# Patient Record
Sex: Male | Born: 1955 | Race: Black or African American | Hispanic: No | Marital: Married | Smoking: Never smoker
Health system: Southern US, Community
[De-identification: ages and names within clinical notes are randomized; demographics above are authoritative.]

## PROBLEM LIST (undated history)

## (undated) DIAGNOSIS — I10 Essential (primary) hypertension: Secondary | ICD-10-CM

## (undated) DIAGNOSIS — I2510 Atherosclerotic heart disease of native coronary artery without angina pectoris: Secondary | ICD-10-CM

## (undated) DIAGNOSIS — E119 Type 2 diabetes mellitus without complications: Secondary | ICD-10-CM

## (undated) DIAGNOSIS — E7800 Pure hypercholesterolemia, unspecified: Secondary | ICD-10-CM

## (undated) HISTORY — PX: CORONARY STENT PLACEMENT: SHX1402

## (undated) HISTORY — PX: CORONARY ARTERY BYPASS GRAFT: SHX141

## (undated) HISTORY — PX: AORTIC VALVE REPLACEMENT: SHX41

---

## 2006-02-10 ENCOUNTER — Inpatient Hospital Stay (HOSPITAL_COMMUNITY): Admission: AD | Admit: 2006-02-10 | Discharge: 2006-02-12 | Payer: Self-pay | Admitting: Internal Medicine

## 2006-02-11 ENCOUNTER — Encounter (INDEPENDENT_AMBULATORY_CARE_PROVIDER_SITE_OTHER): Payer: Self-pay | Admitting: Cardiology

## 2006-02-27 ENCOUNTER — Ambulatory Visit (HOSPITAL_COMMUNITY): Admission: RE | Admit: 2006-02-27 | Discharge: 2006-02-27 | Payer: Self-pay | Admitting: Cardiology

## 2007-09-15 ENCOUNTER — Encounter: Admission: RE | Admit: 2007-09-15 | Discharge: 2007-09-15 | Payer: Self-pay | Admitting: Cardiology

## 2007-10-08 ENCOUNTER — Ambulatory Visit (HOSPITAL_COMMUNITY): Admission: RE | Admit: 2007-10-08 | Discharge: 2007-10-08 | Payer: Self-pay | Admitting: Cardiology

## 2008-12-01 ENCOUNTER — Ambulatory Visit (HOSPITAL_COMMUNITY): Admission: RE | Admit: 2008-12-01 | Discharge: 2008-12-01 | Payer: Self-pay | Admitting: Cardiology

## 2009-12-11 ENCOUNTER — Observation Stay (HOSPITAL_COMMUNITY): Admission: EM | Admit: 2009-12-11 | Discharge: 2009-12-12 | Payer: Self-pay | Admitting: Emergency Medicine

## 2010-08-18 LAB — CBC
HCT: 38.1 % — ABNORMAL LOW (ref 39.0–52.0)
MCH: 32.3 pg (ref 26.0–34.0)
MCH: 33.1 pg (ref 26.0–34.0)
MCHC: 33.5 g/dL (ref 30.0–36.0)
MCHC: 33.7 g/dL (ref 30.0–36.0)
MCV: 95.9 fL (ref 78.0–100.0)
MCV: 96.1 fL (ref 78.0–100.0)
MCV: 96.3 fL (ref 78.0–100.0)
Platelets: 138 10*3/uL — ABNORMAL LOW (ref 150–400)
RDW: 13.2 % (ref 11.5–15.5)
WBC: 3.5 10*3/uL — ABNORMAL LOW (ref 4.0–10.5)
WBC: 4.4 10*3/uL (ref 4.0–10.5)
WBC: 7.1 10*3/uL (ref 4.0–10.5)

## 2010-08-18 LAB — POCT I-STAT, CHEM 8
BUN: 9 mg/dL (ref 6–23)
Calcium, Ion: 0.95 mmol/L — ABNORMAL LOW (ref 1.12–1.32)
Chloride: 105 mEq/L (ref 96–112)
Creatinine, Ser: 1.1 mg/dL (ref 0.4–1.5)
Glucose, Bld: 101 mg/dL — ABNORMAL HIGH (ref 70–99)
Hemoglobin: 13.9 g/dL (ref 13.0–17.0)
Potassium: 2.7 mEq/L — CL (ref 3.5–5.1)
Sodium: 141 mEq/L (ref 135–145)

## 2010-08-18 LAB — PROTIME-INR: INR: 0.99 (ref 0.00–1.49)

## 2010-08-18 LAB — LIPID PANEL
Cholesterol: 152 mg/dL (ref 0–200)
LDL Cholesterol: 95 mg/dL (ref 0–99)
Triglycerides: 56 mg/dL (ref <150)

## 2010-08-18 LAB — HEPARIN LEVEL (UNFRACTIONATED): Heparin Unfractionated: 0.32 IU/mL (ref 0.30–0.70)

## 2010-08-18 LAB — CARDIAC PANEL(CRET KIN+CKTOT+MB+TROPI)
CK, MB: 1 ng/mL (ref 0.3–4.0)
CK, MB: 1.1 ng/mL (ref 0.3–4.0)
Total CK: 126 U/L (ref 7–232)
Total CK: 149 U/L (ref 7–232)
Troponin I: 0.01 ng/mL (ref 0.00–0.06)

## 2010-08-18 LAB — DIFFERENTIAL
Basophils Absolute: 0 10*3/uL (ref 0.0–0.1)
Eosinophils Absolute: 0.1 10*3/uL (ref 0.0–0.7)
Eosinophils Relative: 1 % (ref 0–5)
Lymphocytes Relative: 36 % (ref 12–46)
Lymphs Abs: 1.6 10*3/uL (ref 0.7–4.0)
Monocytes Absolute: 0.6 10*3/uL (ref 0.1–1.0)
Neutrophils Relative %: 48 % (ref 43–77)

## 2010-08-18 LAB — URINALYSIS, ROUTINE W REFLEX MICROSCOPIC
Hgb urine dipstick: NEGATIVE
Ketones, ur: NEGATIVE mg/dL
Protein, ur: NEGATIVE mg/dL
pH: 7 (ref 5.0–8.0)

## 2010-08-18 LAB — COMPREHENSIVE METABOLIC PANEL
ALT: 15 U/L (ref 0–53)
Albumin: 4.2 g/dL (ref 3.5–5.2)
Calcium: 9.2 mg/dL (ref 8.4–10.5)
Chloride: 105 mEq/L (ref 96–112)
Creatinine, Ser: 1.04 mg/dL (ref 0.4–1.5)
GFR calc non Af Amer: >60 mL/min (ref >60)
Total Bilirubin: 0.8 mg/dL (ref 0.3–1.2)

## 2010-08-18 LAB — BASIC METABOLIC PANEL
CO2: 29 mEq/L (ref 19–32)
Calcium: 8.7 mg/dL (ref 8.4–10.5)
GFR calc non Af Amer: >60 mL/min (ref >60)
Glucose, Bld: 105 mg/dL — ABNORMAL HIGH (ref 70–99)

## 2010-08-18 LAB — POCT CARDIAC MARKERS: CKMB, poc: <1 ng/mL — ABNORMAL LOW (ref 1.0–8.0)

## 2010-08-18 LAB — MAGNESIUM: Magnesium: 2.4 mg/dL (ref 1.5–2.5)

## 2010-09-08 LAB — CREATININE, SERUM: GFR calc non Af Amer: >60 mL/min (ref >60)

## 2010-10-18 NOTE — Discharge Summary (Signed)
Jeffery Conner, Jeffery Conner               ACCOUNT NO.:  [id number]   MEDICAL RECORD NO.:  [id number]          PATIENT TYPE:  INP   LOCATION:  2919                         FACILITY:  [facility]   PHYSICIAN:  Lonia Blood, M.D.       DATE OF BIRTH:  1955-08-15   DATE OF ADMISSION:  02/10/2006  DATE OF DISCHARGE:  02/12/2006                                 DISCHARGE SUMMARY   PRIMARY CARE PHYSICIAN:  Dr. Andi Devon   DISCHARGE DIAGNOSES:  1. Coronary artery disease status post right coronary artery stent      placement by Dr. Jacinto Halim.  2. Slight distension of the ascending aorta and thoracic aorta.  The      patient will need follow-up CT angiogram of the chest and abdomen for      further evaluation.  3. Hypertension.  4. Hyperlipidemia.   DISCHARGE MEDICATIONS:  1. Plavix 75 mg by mouth daily.  2. Aspirin 325 mg by mouth daily.  3. Vytorin 10/20 mg by mouth daily.  4. Dyazide 25/37.5 mg daily.  5. Lotrel 10/20 mg by mouth daily.  6. Potassium chloride 10 mEq by mouth daily.   CONDITION AT DISCHARGE:  Jeffery Conner was discharged in good condition.  At  the time of discharge he was alert, oriented.  The patient was instructed to  follow up with Dr. Jacinto Halim and with Dr. Renae Gloss.  The patient was also  instructed to take his Plavix on a daily basis and to report immediately  back to the emergency room in case he has recurrent chest pain.   HISTORY AND PHYSICAL:  For complete admission history and physical refer to  the printed history and physical done by Dr. Renae Gloss on February 10, 2006.  Briefly, Jeffery Conner, a 55 year old gentleman with some family history of  coronary artery disease, presented to his primary care physician complaining  with worsening chest pain whenever performing an effort.  He was admitted  the hospital for further workup and observation.   HOSPITAL COURSE:  #1 - CHEST PAIN.  Mr. [name] was admitted on February 10, 2006, with diagnosis of unstable angina.  He was  observed on the telemetry  unit at [facility] and he had three sets of cardiac enzymes.  His  CKs, CK-MBs, and troponin I's were essentially within normal limits with  just mild elevation of the total CK.  It was deemed that the patient did not  suffer a heart attack.  The patient was seen in consultation by Dr. Jacinto Halim  from cardiology services and on February 11, 2006, the patient was taken to  the cardiac catheterization laboratory where he had a percutaneous  intervention to the right coronary artery.  The patient tolerated the  procedure without any complications.  Post placement of this bare-metal  stent the patient will need to take at least 6 weeks of Plavix and he will  follow up with Dr. Jacinto Halim.  Of note, the patient did not have any recurrent  chest pain while he was hospitalized at [facility].   #2 - HYPERLIPIDEMIA.  Mr.  Conner' fasting lipid panel showed an LDL of 121.  Given the fact that he was newly diagnosed with coronary artery disease, we  would treat this hyperlipidemia with a combination of statin and ezetimibe  on a daily basis.  The patient will have a further fasting lipid panel  checked in 6 weeks by his primary care physician to further adjust the  treatment as needed.   #3 - HYPOKALEMIA.  Upon admission Jeffery Conner was found to have a potassium  of 3.0.  He had aggressive potassium replacement and by hospital day #3 a  repeat potassium level at the time of discharge was 3.3.  We have discharged  the patient on daily potassium supplements given the fact that he was  already on triamterene  but without good success of controlling his  potassium.      Lonia Blood, M.D.  Electronically Signed     SL/MEDQ  D:  02/13/2006  T:  02/16/2006  Job:  [id number]   cc:   Merlene Laughter. Renae Gloss, M.D.  Cristy Hilts. Jacinto Halim, MD

## 2010-10-18 NOTE — Cardiovascular Report (Signed)
Jeffery Conner, Jeffery Conner               ACCOUNT NO.:  [id number]   MEDICAL RECORD NO.:  [id number]          PATIENT TYPE:  INP   LOCATION:  2919                         FACILITY:  [facility]   PHYSICIAN:  Cristy Hilts. Jacinto Halim, MD       DATE OF BIRTH:  February 13, 1956   DATE OF PROCEDURE:  02/11/2006  DATE OF DISCHARGE:                              CARDIAC CATHETERIZATION   REFERRING PHYSICIAN:  Merlene Laughter. Renae Gloss, M.D.   PROCEDURES PERFORMED:  1. Left ventriculography.  2. Selective right and left coronary arteriography.  3. Ascending abdominal aortogram.  4. PTA and stenting of the right coronary artery.   INDICATION:  Jeffery Conner is a 55 year old gentleman with a  longstanding history of hypertension and a strong family history of  premature coronary artery disease who has been complaining of new onset of  exertional angina pectoris in the last 2 weeks and which has accelerated in  the last 1 week.  He was admitted to the hospital for unstable anginal  symptoms.  Hence, given his strong family history, he was brought to the  cardiac catheterization lab to evaluate his coronary anatomy.   Ascending aortogram, abdominal aortogram was performed because of suspicion  for ascending aortic aneurysm and abdominal aneurysm.  Also, there was  suspicion for congenital bicuspid aortic valve and aortic regurgitation.   HEMODYNAMIC DATA:  The left ventricular pressure was 139/11 with end-  diastolic pressure of 17 mmHg.  The aortic pressures were 137/93 with a mean  of 112 mmHg.  There was no significant pressure gradient across the aortic  valve.   ANGIOGRAPHIC DATA:  Left ventricle.  Left ventricular systolic function was  normal with the ejection fraction of 60%.  No significant mitral  regurgitation.   Right coronary artery.  The right coronary artery is a very large caliber  vessel.  There is mild diffuse disease in its proximal and mid segment  constituting 10-15%.  In the mid segment of the  RCA, at the site of RV  branch origin, there is a focal dissection with 80% stenosis.  The LV branch  has a 90% stenosis.  The right coronary artery continues as a moderate sized  PDA and a large PLA which has mild disease.   Left main.  Left main is a very large caliber vessel.   Circumflex.  Circumflex is very small and is normal.   LAD.  LAD is a large caliber vessel.  It gives rise into a large diagonal 1,  which is tortuous, and a moderate sized diagonal 2.  LAD wraps around the  apex.  It is smooth and otherwise appears to be normal.   Ascending aortogram and thoracic aortogram.  Ascending aortogram and  thoracic aortogram revealed diffuse aortic dilatation of the entire thoracic  aorta.  There was bicuspid aortic valve with mild aortic regurgitation.   Abdominal aortogram.  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  They were widely patent.  The abdominal aorta  was tortuous.  The aortoiliac bifurcation was patent.   INTERVENTIONAL DATA:  Successful  PTCA and direct stenting of the mid right  coronary artery with a 4.0 x 18 mm Vision stent deployed at 14 atmospheres  of pressure with reduction of stenosis from 80% to 0% with TIMI-3 to TIMI-3  flow maintained at the end of the procedure.  There was no evidence of  dissection or thrombus.  The LV branch was still patent at the end of the  procedure.   RECOMMENDATIONS:  The patient will need outpatient CT angiography of his  thoracic and abdominal aorta.  He will need aspirin and Plavix for at least  a period of 4-6 weeks and aspirin indefinitely.  He will need continued risk  factor evaluation.   TECHNIQUE OF THE PROCEDURE:  Under usual sterile precautions, using a 6  French right femoral artery axis a 6 [place] multipurpose B2 catheter was  advanced into the ascending aorta with a 0.025 J wire.  The catheter was  gently advanced into the left ventricle, left ventricular pressures were   monitored.  Hand contrast injection of the left ventricle was performed both  in the LAO and RAO projections.  The catheter was flushed with saline,  pulled back into the ascending aorta and pressure gradient across the aortic  valve was monitored.  The ascending aortogram was performed but it was  suboptimal.  The catheter was then pulled out of the body in the usual  fashion and a 6 [place] AL1 catheter was utilized and advanced into the root  of the aorta and right coronary artery was selectively engaged and  angiography was performed.  Then the same catheter was utilized to engage  the left main coronary artery and angiography was performed.  Then the  catheter was pulled out of the body in usual fashion and a 6 French pigtail  catheter was advanced into the root of the aorta and ascending and thoracic  aortogram was performed with injection of 40 mL of contrast at 20 mL per  second.  Then the same catheter was pulled back into the abdominal aorta and  abdominal aortogram was performed with injection of 20 mL of contrast.  Then  the catheter was pulled out of the body over a J wire.   TECHNIQUE AND PREVENTION:  A 6 French sheath was changed to a 7 [place]  sheath because of inability to use a 6 [place] guide catheter to engage right  coronary artery.  Because of abdominal aortic tortuosity, there was twisting  of the guide catheter.  Hence, a 6 French sheath was exchanged to a 7 [place]  long 45 cm sheath and this sheath was introduced into the abdominal aorta.  Then using a 7 [place] FR5 guide the right coronary artery was selectively  engaged.  Using Angiomax for anticoagulation, a 190 cm x 0.014 inch Marker  ATW guide wire was advanced in the right coronary artery and placed in the  distal RCA.  The tip was placed in the distal RCA.  Lesion length was  carefully measured.  Then I decided to proceed with direct stenting with a 4.0 x 18 mm Vision stent and this was deployed at 14  atmospheres of pressure  peak for a total of a 60 seconds.  Post stent deployment the balloon was  withdrawn and 200 mcg of intracoronary nitroglycerin was administered.  Excellent results were noted.  Then the guide wire was withdrawn.  Guide  catheter disengaged and pulled out of the body in the usual fashion.  Patient tolerated the procedure  well.  The long sheath was changed to a  short 7 [place] sheath.  This was sutured in place and patient was  transferred to the holding area in a stable condition.  No immediate  complications noted.      Cristy Hilts. Jacinto Halim, MD  Electronically Signed     JRG/MEDQ  D:  02/11/2006  T:  02/12/2006  Job:  [id number]   cc:   Merlene Laughter. Renae Gloss, M.D.

## 2011-04-15 DIAGNOSIS — I10 Essential (primary) hypertension: Secondary | ICD-10-CM | POA: Insufficient documentation

## 2011-04-15 DIAGNOSIS — I2510 Atherosclerotic heart disease of native coronary artery without angina pectoris: Secondary | ICD-10-CM | POA: Insufficient documentation

## 2011-04-15 DIAGNOSIS — Z954 Presence of other heart-valve replacement: Secondary | ICD-10-CM | POA: Insufficient documentation

## 2011-06-30 ENCOUNTER — Other Ambulatory Visit: Payer: Self-pay

## 2011-06-30 ENCOUNTER — Emergency Department (INDEPENDENT_AMBULATORY_CARE_PROVIDER_SITE_OTHER): Payer: 59

## 2011-06-30 ENCOUNTER — Emergency Department (HOSPITAL_BASED_OUTPATIENT_CLINIC_OR_DEPARTMENT_OTHER)
Admission: EM | Admit: 2011-06-30 | Discharge: 2011-07-01 | Disposition: A | Discharge status: Home / Self Care | Payer: 59 | Attending: Emergency Medicine | Admitting: Emergency Medicine

## 2011-06-30 ENCOUNTER — Encounter (HOSPITAL_BASED_OUTPATIENT_CLINIC_OR_DEPARTMENT_OTHER): Payer: Self-pay | Admitting: *Deleted

## 2011-06-30 DIAGNOSIS — I517 Cardiomegaly: Secondary | ICD-10-CM

## 2011-06-30 DIAGNOSIS — R079 Chest pain, unspecified: Secondary | ICD-10-CM | POA: Insufficient documentation

## 2011-06-30 DIAGNOSIS — E876 Hypokalemia: Secondary | ICD-10-CM | POA: Insufficient documentation

## 2011-06-30 DIAGNOSIS — I10 Essential (primary) hypertension: Secondary | ICD-10-CM | POA: Insufficient documentation

## 2011-06-30 DIAGNOSIS — Z79899 Other long term (current) drug therapy: Secondary | ICD-10-CM | POA: Insufficient documentation

## 2011-06-30 DIAGNOSIS — R0789 Other chest pain: Secondary | ICD-10-CM

## 2011-06-30 DIAGNOSIS — E7800 Pure hypercholesterolemia, unspecified: Secondary | ICD-10-CM | POA: Insufficient documentation

## 2011-06-30 HISTORY — DX: Essential (primary) hypertension: I10

## 2011-06-30 HISTORY — DX: Pure hypercholesterolemia, unspecified: E78.00

## 2011-06-30 LAB — COMPREHENSIVE METABOLIC PANEL
AST: 16 U/L (ref 0–37)
Albumin: 4.2 g/dL (ref 3.5–5.2)
Chloride: 99 mEq/L (ref 96–112)
Creatinine, Ser: 1 mg/dL (ref 0.50–1.35)
Potassium: 2.5 mEq/L — CL (ref 3.5–5.1)
Total Bilirubin: 0.3 mg/dL (ref 0.3–1.2)
Total Protein: 7.2 g/dL (ref 6.0–8.3)

## 2011-06-30 LAB — CARDIAC PANEL(CRET KIN+CKTOT+MB+TROPI)
Relative Index: 1.4 (ref 0.0–2.5)
Total CK: 265 U/L — ABNORMAL HIGH (ref 7–232)
Troponin I: <0.3 ng/mL (ref <0.30)

## 2011-06-30 LAB — DIFFERENTIAL
Basophils Absolute: 0 10*3/uL (ref 0.0–0.1)
Basophils Relative: 1 % (ref 0–1)
Eosinophils Absolute: 0.1 10*3/uL (ref 0.0–0.7)
Monocytes Absolute: 0.7 10*3/uL (ref 0.1–1.0)
Neutro Abs: 1.7 10*3/uL (ref 1.7–7.7)
Neutrophils Relative %: 39 % — ABNORMAL LOW (ref 43–77)

## 2011-06-30 LAB — CBC
MCHC: 35.5 g/dL (ref 30.0–36.0)
RDW: 12.6 % (ref 11.5–15.5)

## 2011-06-30 MED ORDER — POTASSIUM CHLORIDE 10 MEQ/100ML IV SOLN
10.0000 meq | Freq: Once | INTRAVENOUS | Status: AC
  Administered 2011-06-30: 10 meq via INTRAVENOUS
  Filled 2011-06-30: qty 100

## 2011-06-30 MED ORDER — POTASSIUM CHLORIDE CRYS ER 20 MEQ PO TBCR
40.0000 meq | EXTENDED_RELEASE_TABLET | Freq: Two times a day (BID) | ORAL | Status: DC
  Administered 2011-06-30: 40 meq via ORAL
  Filled 2011-06-30: qty 2

## 2011-06-30 NOTE — ED Provider Notes (Signed)
This chart was scribed for Joya Gaskins, MD by Williemae Natter. The patient was seen in room MH08/MH08 at 11:01 PM.  CSN: [id number]  Arrival date & time 06/30/11  2052   First MD Initiated Contact with Patient 06/30/11 2258      Chief Complaint  Patient presents with  . Chest Pain     HPI Jeffery Conner is a 56 y.o. male with a history of stent placement and valve replacement who presents to the Emergency Department complaining of pressure in chest. Rates pain a 1/10 at the movement. Pt reported feeling pressure in chest for 3 days with little to no pain. Pt denies any sweating, nausea, vomit, shortness of breath, weakness, pain, coughing, diarrhea, or abdominal pain. Stent and valve replacements done at [facility]. Reports pressure in chest worse with bending forward No exertional CP It is not pleuritic No trauma/injury to chest  PCP- Dr. Renae Gloss Past Medical History  Diagnosis Date  . Hypertension   . Hypercholesterolemia     Past Surgical History  Procedure Date  . Aortic valve replacement   . Coronary artery bypass graft   . Coronary stent placement     No family history on file.  History  Substance Use Topics  . Smoking status: Never Smoker   . Smokeless tobacco: Not on file  . Alcohol Use: No      Review of Systems 10 Systems reviewed and are negative for acute change except as noted in the HPI.  Allergies  Review of patient's allergies indicates no known allergies.  Home Medications   Current Outpatient Rx  Name Route Sig Dispense Refill  . AMLODIPINE BESYLATE 10 MG PO TABS Oral Take 10 mg by mouth daily.    . ASPIRIN 325 MG PO TBEC Oral Take 325 mg by mouth daily.    Marland Kitchen BENAZEPRIL HCL 20 MG PO TABS Oral Take 20 mg by mouth daily.    . CHLORTHALIDONE 25 MG PO TABS Oral Take 25 mg by mouth daily.    . FESOTERODINE FUMARATE ER 8 MG PO TB24 Oral Take 8 mg by mouth daily.    Marland Kitchen LABETALOL HCL 200 MG PO TABS Oral Take 200 mg by mouth 2 (two) times  daily.    . B-12 5000 MCG PO TBDP Oral Take 1 tablet by mouth 2 (two) times a week. Take on Monday and Thursday    . POTASSIUM CHLORIDE CRYS ER 20 MEQ PO TBCR Oral Take 20 mEq by mouth 2 (two) times daily.    Marland Kitchen PRAVASTATIN SODIUM 40 MG PO TABS Oral Take 40 mg by mouth daily.      BP 177/94  Pulse 62  Temp(Src) 98.2 F (36.8 C) (Oral)  Resp 16  Ht 6' (1.829 m)  Wt 224 lb (101.606 kg)  BMI 30.38 kg/m2  SpO2 99%  Physical Exam CONSTITUTIONAL: Well developed/well nourished HEAD AND FACE: Normocephalic/atraumatic EYES: EOMI/PERRL ENMT: Mucous membranes moist NECK: supple no meningeal signs SPINE:entire spine nontender CV: murmur noted LUNGS: Lungs are clear to auscultation bilaterally, no apparent distress ABDOMEN: soft, nontender, no rebound or guarding GU:no cva tenderness NEURO: Pt is awake/alert, moves all extremitiesx4, no focal motor defitis No distress noted EXTREMITIES: pulses normal, full ROM, no edema noted SKIN: warm, color normal PSYCH: no abnormalities of mood noted  ED Course  Procedures  DIAGNOSTIC STUDIES: Oxygen Saturation is 99% on room air, normal by my interpretation.    COORDINATION OF CARE:  Medications  potassium chloride SA (K-DUR,KLOR-CON) CR  tablet 40 mEq   potassium chloride 10 mEq in 100 mL IVPB       Labs Reviewed  CARDIAC PANEL(CRET KIN+CKTOT+MB+TROPI) - Abnormal; Notable for the following:    Total CK 265 (*)    All other components within normal limits  CBC - Abnormal; Notable for the following:    Platelets 144 (*)    All other components within normal limits  DIFFERENTIAL - Abnormal; Notable for the following:    Neutrophils Relative 39 (*)    Monocytes Relative 16 (*)    All other components within normal limits  COMPREHENSIVE METABOLIC PANEL - Abnormal; Notable for the following:    Potassium 2.5 (*)    Glucose, Bld 117 (*)    GFR calc non Af Amer 82 (*)    All other components within normal limits   Dg Chest 2  View  06/30/2011  *RADIOLOGY REPORT*  Clinical Data: Left chest pressure  CHEST - 2 VIEW  Comparison: 12/11/2009  Findings: Enlargement of cardiac silhouette post median sternotomy, CABG, and valve replacement. Tortuous aorta. Pulmonary vascularity normal. Lungs clear. No pleural effusion or pneumothorax. Minimal chronic peribronchial thickening. Numerous cardiac monitoring lines project over chest. Bones unremarkable.  IMPRESSION: Enlargement of cardiac silhouette. No acute abnormalities.  Original Report Authenticated By: Lollie Marrow, M.D.    12:01 AM Pt with very atypical CP He s/p AV replacement for bicuspid AV Also has h/o CAD  He agrees to stay for repeat troponin and for KCL supplementation but does not want admission, no significant EKG changes Well appearing, no distress Doubt ACS/Dissection/PE at this time Doubt pericardial effusion.  He is nontoxic, using his iPad, resting comfortably  The patient appears reasonably screened and/or stabilized for discharge and I doubt any other medical condition or other [facility] requiring further screening, evaluation, or treatment in the ED at this time prior to discharge.   MDM  Nursing notes reviewed and considered in documentation All labs/vitals reviewed and considered xrays reviewed and considered Previous records reviewed and considered      Date: 07/01/2011  Rate: 53  Rhythm: sinus bradycardia  QRS Axis: left  Intervals: PR prolonged  ST/T Wave abnormalities: nonspecific ST changes  Conduction Disutrbances:first-degree A-V block   Narrative Interpretation:   Old EKG Reviewed: unchanged      I personally performed the services described in this documentation, which was scribed in my presence. The recorded information has been reviewed and considered.      Joya Gaskins, MD 07/01/11 [phone]

## 2011-06-30 NOTE — ED Notes (Signed)
Received pt. From triage via w/c, pt. Alert and oriented, transferred to stretcher, gait steady, NAD noted, Pt. Reports chest discomfort x3 days,

## 2011-06-30 NOTE — ED Notes (Signed)
Pt c/o left chest pressure since Saturday. Pt denies SOB, nausea or diaphoresis.

## 2011-07-01 NOTE — ED Notes (Signed)
Pt. Alert and oriented, NAD noted, respirations even and regular, pt ambulatory gait steady

## 2012-02-29 ENCOUNTER — Emergency Department (HOSPITAL_BASED_OUTPATIENT_CLINIC_OR_DEPARTMENT_OTHER): Payer: 59

## 2012-02-29 ENCOUNTER — Emergency Department (HOSPITAL_BASED_OUTPATIENT_CLINIC_OR_DEPARTMENT_OTHER)
Admission: EM | Admit: 2012-02-29 | Discharge: 2012-02-29 | Disposition: A | Discharge status: Home / Self Care | Payer: 59 | Attending: Emergency Medicine | Admitting: Emergency Medicine

## 2012-02-29 ENCOUNTER — Encounter (HOSPITAL_BASED_OUTPATIENT_CLINIC_OR_DEPARTMENT_OTHER): Payer: Self-pay | Admitting: Emergency Medicine

## 2012-02-29 DIAGNOSIS — I10 Essential (primary) hypertension: Secondary | ICD-10-CM | POA: Insufficient documentation

## 2012-02-29 DIAGNOSIS — L02419 Cutaneous abscess of limb, unspecified: Secondary | ICD-10-CM | POA: Insufficient documentation

## 2012-02-29 DIAGNOSIS — Z7982 Long term (current) use of aspirin: Secondary | ICD-10-CM | POA: Insufficient documentation

## 2012-02-29 DIAGNOSIS — L03116 Cellulitis of left lower limb: Secondary | ICD-10-CM

## 2012-02-29 DIAGNOSIS — I2510 Atherosclerotic heart disease of native coronary artery without angina pectoris: Secondary | ICD-10-CM | POA: Insufficient documentation

## 2012-02-29 HISTORY — DX: Atherosclerotic heart disease of native coronary artery without angina pectoris: I25.10

## 2012-02-29 MED ORDER — BACITRACIN 500 UNIT/GM EX OINT
1.0000 "application " | TOPICAL_OINTMENT | Freq: Two times a day (BID) | CUTANEOUS | Status: DC
  Administered 2012-02-29: 1 via TOPICAL
  Filled 2012-02-29: qty 0.9

## 2012-02-29 MED ORDER — BACITRACIN ZINC 500 UNIT/GM EX OINT: TOPICAL_OINTMENT | Freq: Two times a day (BID) | CUTANEOUS | Status: AC

## 2012-02-29 NOTE — ED Notes (Signed)
Pt c/o skin infection to LLE; was admitted to [facility] recently for same (d/c'd Wed, was there x 6d); swelling, weeping wounds present on LLE

## 2012-02-29 NOTE — ED Provider Notes (Signed)
History     CSN: [id number]  Arrival date & time 02/29/12  1112   First MD Initiated Contact with Patient 02/29/12 1201      Chief Complaint  Patient presents with  . Cellulitis    (Consider location/radiation/quality/duration/timing/severity/associated sxs/prior treatment) HPI Comments: 56 year old male with a history of a recent cellulitis to his left lower extremity who spent 6 days in the hospital receiving therapy including antibiotics for his leg. He was discharged from the hospital on Wednesday, for the last several days he has been performing dressing changes, continuing his antibiotics and states that over the last 24 hours he has noticed increased weeping from the wound. He is using topical Neosporin on the wound. He is unsure of the name of his antibiotics that he is taking.  He denies fevers, states that the swelling is improving, the redness has completely resolved and the only place that is redness over the surface of the wound. He denies any foul smell, fever, chills, nausea, vomiting. He has had a normal appetite. He does have mild pain with ambulation.  The history is provided by the patient and the spouse.    Past Medical History  Diagnosis Date  . Hypertension   . Hypercholesterolemia   . Coronary artery disease     Past Surgical History  Procedure Date  . Aortic valve replacement   . Coronary artery bypass graft   . Coronary stent placement     No family history on file.  History  Substance Use Topics  . Smoking status: Never Smoker   . Smokeless tobacco: Not on file  . Alcohol Use: No      Review of Systems  All other systems reviewed and are negative.    Allergies  Review of patient's allergies indicates no known allergies.  Home Medications   Current Outpatient Rx  Name Route Sig Dispense Refill  . CEPHALEXIN 500 MG PO CAPS Oral Take 500 mg by mouth.    . SULFAMETHOXAZOLE-TMP DS 800-160 MG PO TABS Oral Take 1 tablet by mouth.    .  AMLODIPINE BESYLATE 10 MG PO TABS Oral Take 10 mg by mouth daily.    . ASPIRIN 325 MG PO TBEC Oral Take 325 mg by mouth daily.    Marland Kitchen BACITRACIN ZINC 500 UNIT/GM EX OINT Topical Apply topically 2 (two) times daily. 120 g 2  . BENAZEPRIL HCL 20 MG PO TABS Oral Take 20 mg by mouth daily.    . CHLORTHALIDONE 25 MG PO TABS Oral Take 25 mg by mouth daily.    . FESOTERODINE FUMARATE ER 8 MG PO TB24 Oral Take 8 mg by mouth daily.    Marland Kitchen LABETALOL HCL 200 MG PO TABS Oral Take 200 mg by mouth 2 (two) times daily.    . B-12 5000 MCG PO TBDP Oral Take 1 tablet by mouth 2 (two) times a week. Take on Monday and Thursday    . POTASSIUM CHLORIDE CRYS ER 20 MEQ PO TBCR Oral Take 20 mEq by mouth 2 (two) times daily.    Marland Kitchen PRAVASTATIN SODIUM 40 MG PO TABS Oral Take 40 mg by mouth daily.      BP 135/90  Pulse 76  Temp 98.5 F (36.9 C) (Oral)  Resp 18  SpO2 99%  Physical Exam  Nursing note and vitals reviewed. Constitutional: He appears well-developed and well-nourished. No distress.  HENT:  Head: Normocephalic and atraumatic.  Mouth/Throat: Oropharynx is clear and moist. No oropharyngeal exudate.  Eyes: Conjunctivae normal  and EOM are normal. Pupils are equal, round, and reactive to light. Right eye exhibits no discharge. Left eye exhibits no discharge. No scleral icterus.  Neck: Normal range of motion. Neck supple. No JVD present. No thyromegaly present.  Cardiovascular: Normal rate, regular rhythm, normal heart sounds and intact distal pulses.  Exam reveals no gallop and no friction rub.   No murmur heard. Pulmonary/Chest: Effort normal and breath sounds normal. No respiratory distress. He has no wheezes. He has no rales.  Abdominal: Soft. Bowel sounds are normal. He exhibits no distension and no mass. There is no tenderness.  Musculoskeletal: Normal range of motion. He exhibits edema ( Asymmetrical 1+ edema to the left lower extremity including the foot). He exhibits no tenderness.  Lymphadenopathy:     He has no cervical adenopathy.  Neurological: He is alert. Coordination normal.  Skin: Skin is warm and dry. Rash noted. There is erythema.       Cobblestoned erythematous base to a wound that is approximately 8 cm in diameter in a square-like formation over the left lower extremity. There is serous drainage, no foul odor, no purulence, no surrounding erythema.  Psychiatric: He has a normal mood and affect. His behavior is normal.    ED Course  Procedures (including critical care time)  Labs Reviewed - No data to display Dg Tibia/fibula Left  02/29/2012  *RADIOLOGY REPORT*  Clinical Data: Skin infection  LEFT TIBIA AND FIBULA - 2 VIEW  Comparison: None.  Findings: No fracture or dislocation is seen.  The joint spaces are preserved.  Moderate soft tissue swelling overlying the lateral malleolus.  Surgical clips along the medial aspect of the proximal tibia.  No radiographic findings to suggest osteomyelitis.  IMPRESSION: No radiographic findings to suggest osteomyelitis.  Moderate soft tissue swelling overlying the lateral malleolus.   Original Report Authenticated By: Charline Bills, M.D.      1. Cellulitis of left lower leg       MDM  No other joint arthropathies, I suspect that he is improving despite the appearance of the wound which does have increased drainage from the wound. It is serous, he is afebrile, this could be a reaction to the topical antibiotic cream. X-ray pending to rule out subcutaneous air though the patient has minimal tenderness on exam and minimal pain.  Nursing staff has verified that the patient is currently taking bacitracin and Keflex for antibiotic coverage. The x-rays have been interpreted as normal except for some soft tissue swelling but no signs of emphysema of the tissues or underlying osteomyelitis. The patient states that he is comfortable going home for a recheck, bacitracin to be applied and no more Neosporin. There is also no indication to change  antibiotics at this time.      Vida Roller, MD 02/29/12 1335

## 2012-12-23 ENCOUNTER — Emergency Department (HOSPITAL_BASED_OUTPATIENT_CLINIC_OR_DEPARTMENT_OTHER)
Admission: EM | Admit: 2012-12-23 | Discharge: 2012-12-23 | Disposition: A | Discharge status: Home / Self Care | Payer: 59 | Attending: Emergency Medicine | Admitting: Emergency Medicine

## 2012-12-23 ENCOUNTER — Encounter (HOSPITAL_BASED_OUTPATIENT_CLINIC_OR_DEPARTMENT_OTHER): Payer: Self-pay | Admitting: *Deleted

## 2012-12-23 DIAGNOSIS — T398X1A Poisoning by other nonopioid analgesics and antipyretics, not elsewhere classified, accidental (unintentional), initial encounter: Secondary | ICD-10-CM | POA: Insufficient documentation

## 2012-12-23 DIAGNOSIS — T50901A Poisoning by unspecified drugs, medicaments and biological substances, accidental (unintentional), initial encounter: Secondary | ICD-10-CM

## 2012-12-23 DIAGNOSIS — I2510 Atherosclerotic heart disease of native coronary artery without angina pectoris: Secondary | ICD-10-CM | POA: Insufficient documentation

## 2012-12-23 DIAGNOSIS — Y9389 Activity, other specified: Secondary | ICD-10-CM | POA: Insufficient documentation

## 2012-12-23 DIAGNOSIS — Z79899 Other long term (current) drug therapy: Secondary | ICD-10-CM | POA: Insufficient documentation

## 2012-12-23 DIAGNOSIS — E876 Hypokalemia: Secondary | ICD-10-CM

## 2012-12-23 DIAGNOSIS — R51 Headache: Secondary | ICD-10-CM | POA: Insufficient documentation

## 2012-12-23 DIAGNOSIS — Z7982 Long term (current) use of aspirin: Secondary | ICD-10-CM | POA: Insufficient documentation

## 2012-12-23 DIAGNOSIS — Y929 Unspecified place or not applicable: Secondary | ICD-10-CM | POA: Insufficient documentation

## 2012-12-23 DIAGNOSIS — I10 Essential (primary) hypertension: Secondary | ICD-10-CM | POA: Insufficient documentation

## 2012-12-23 DIAGNOSIS — E785 Hyperlipidemia, unspecified: Secondary | ICD-10-CM | POA: Insufficient documentation

## 2012-12-23 LAB — CBC WITH DIFFERENTIAL/PLATELET
Eosinophils Absolute: 0.1 10*3/uL (ref 0.0–0.7)
Eosinophils Relative: 2 % (ref 0–5)
Hemoglobin: 15.6 g/dL (ref 13.0–17.0)
Lymphocytes Relative: 37 % (ref 12–46)
Lymphs Abs: 1.8 10*3/uL (ref 0.7–4.0)
MCH: 32 pg (ref 26.0–34.0)
MCV: 91.2 fL (ref 78.0–100.0)
Monocytes Relative: 11 % (ref 3–12)
Neutrophils Relative %: 49 % (ref 43–77)
RBC: 4.88 MIL/uL (ref 4.22–5.81)
WBC: 4.7 10*3/uL (ref 4.0–10.5)

## 2012-12-23 LAB — COMPREHENSIVE METABOLIC PANEL
ALT: 15 U/L (ref 0–53)
Alkaline Phosphatase: 63 U/L (ref 39–117)
BUN: 22 mg/dL (ref 6–23)
CO2: 30 mEq/L (ref 19–32)
GFR calc Af Amer: 75 mL/min — ABNORMAL LOW (ref >90)
GFR calc non Af Amer: 65 mL/min — ABNORMAL LOW (ref >90)
Glucose, Bld: 122 mg/dL — ABNORMAL HIGH (ref 70–99)
Potassium: 2.5 mEq/L — CL (ref 3.5–5.1)
Sodium: 144 mEq/L (ref 135–145)
Total Protein: 7.6 g/dL (ref 6.0–8.3)

## 2012-12-23 MED ORDER — SODIUM CHLORIDE 0.9 % IV BOLUS (SEPSIS)
1000.0000 mL | INTRAVENOUS | Status: AC
  Administered 2012-12-23: 1000 mL via INTRAVENOUS

## 2012-12-23 MED ORDER — POTASSIUM CHLORIDE CRYS ER 20 MEQ PO TBCR
60.0000 meq | EXTENDED_RELEASE_TABLET | Freq: Once | ORAL | Status: AC
  Administered 2012-12-23: 60 meq via ORAL
  Filled 2012-12-23: qty 3

## 2012-12-23 NOTE — ED Provider Notes (Signed)
History    CSN: [id number] Arrival date & time 12/23/12  0917  First MD Initiated Contact with Patient 12/23/12 [phone]     Chief Complaint  Patient presents with  . Drug Overdose   (Consider location/radiation/quality/duration/timing/severity/associated sxs/prior Treatment HPI  Patient presents after accidental ingestion of multiple medical tablets. Just prior to ED evaluation the patient grabbed the wrong container of medication, and took 8 Excedrin tablets. He had a headache, which is typical for him. He states that since that time he has been anxious about the medication ingestion, but has no new fever, chills, dyspnea, chest pain, headache, confusion, dislocation or any other focal new changes. Patient typically takes an Excedrin for headache. No other recent notable medical issues.  Past Medical History  Diagnosis Date  . Hypertension   . Hypercholesterolemia   . Coronary artery disease    Past Surgical History  Procedure Laterality Date  . Aortic valve replacement    . Coronary artery bypass graft    . Coronary stent placement     History reviewed. No pertinent family history. History  Substance Use Topics  . Smoking status: Never Smoker   . Smokeless tobacco: Not on file  . Alcohol Use: No    Review of Systems  All other systems reviewed and are negative.    Allergies  Review of patient's allergies indicates no known allergies.  Home Medications   Current Outpatient Rx  Name  Route  Sig  Dispense  Refill  . amLODipine (NORVASC) 10 MG tablet   Oral   Take 10 mg by mouth daily.         Marland Kitchen aspirin 325 MG EC tablet   Oral   Take 325 mg by mouth daily.         . bacitracin ointment   Topical   Apply topically 2 (two) times daily.   120 g   2   . benazepril (LOTENSIN) 20 MG tablet   Oral   Take 20 mg by mouth daily.         . cephALEXin (KEFLEX) 500 MG capsule   Oral   Take 500 mg by mouth.         . chlorthalidone (HYGROTON) 25 MG  tablet   Oral   Take 25 mg by mouth daily.         Marland Kitchen Fesoterodine Fumarate (TOVIAZ) 8 MG TB24   Oral   Take 8 mg by mouth daily.         Marland Kitchen labetalol (NORMODYNE) 200 MG tablet   Oral   Take 200 mg by mouth 2 (two) times daily.         . Methylcobalamin (B-12) 5000 MCG TBDP   Oral   Take 1 tablet by mouth 2 (two) times a week. Take on Monday and Thursday         . potassium chloride SA (K-DUR,KLOR-CON) 20 MEQ tablet   Oral   Take 20 mEq by mouth 2 (two) times daily.         . pravastatin (PRAVACHOL) 40 MG tablet   Oral   Take 40 mg by mouth daily.         Marland Kitchen sulfamethoxazole-trimethoprim (BACTRIM DS) 800-160 MG per tablet   Oral   Take 1 tablet by mouth.          There were no vitals taken for this visit. Physical Exam  Nursing note and vitals reviewed. Constitutional: He is oriented to person, place, and time. He  appears well-developed. No distress.  HENT:  Head: Normocephalic and atraumatic.  Eyes: Conjunctivae and EOM are normal.  Cardiovascular: Normal rate and regular rhythm.   Pulmonary/Chest: Effort normal. No stridor. No respiratory distress.  Abdominal: He exhibits no distension.  Musculoskeletal: He exhibits no edema.  Neurological: He is alert and oriented to person, place, and time.  Skin: Skin is warm and dry.  Psychiatric: He has a normal mood and affect.    ED Course  Procedures (including critical care time) Labs Reviewed  CBC WITH DIFFERENTIAL  SALICYLATE LEVEL  ACETAMINOPHEN LEVEL  COMPREHENSIVE METABOLIC PANEL   No results found. No diagnosis found.  Pulse ox 99% room air normal no  Excedrin contains 250 mg acetaminophen, 250 mg ASA and 65mg  caffeine.  I discussed these amounts with the patient.  If the patient took 8 tablets, he consumed 2 mg acetaminophen, 2 mg aspirin. Patient weighs 100 kg. Toxic levels for these medications are approximately 4 g for acetaminophen, 15 g 4 aspirin.  This is initially reassuring.  MDM   Patient presenting after accidentally ingestion of multiple medications.  On exam is awake alert, in no distress.  Quantity of ingestion does not raise suspicion for systemic toxicity, and labs are reassuring for this.  Patient does have mild hypokalemia for which he received supplement.  He was advised to follow up with his primary care physician for likely check in one week.  Gerhard Munch, MD 12/23/12 1133

## 2012-12-23 NOTE — ED Notes (Signed)
Pt asking if we have attending physicians here or if we use "interns". pt reassured that he will be seen by an attending physician.

## 2012-12-23 NOTE — ED Notes (Signed)
Chart accessed to give lab levels to [place] from [employer]

## 2012-12-23 NOTE — ED Notes (Signed)
Pt amb to room 10 with quick steady gait in nad. Pt reports accidentally taking 8 tablets of [facility] brand excedrin migraine tablets around 8:30 am. Denies any c/o.

## 2012-12-23 NOTE — ED Notes (Signed)
Pt noted walking up to nurse's station shouting, states "This iv bag is not running, I've wasted two hours of my valuable time here for nothing!" Dr. Jeraldine Loots hears pt shouting and speaks with pt, pt assisted to room, into bed, iv placed on pump, explained to pt that iv was not running because pt had his arm bent. Iv placed on pump to alert this rn if pt bends arm, warm blanket given, tv turned on to channel of pt choice. Pt states "how am I supposed to get up and turn the channel?!" pt informed of his pending discharge home soon, and instructed to call staff if he wants channel changed. Pt has call light tied to bed rail.

## 2012-12-23 NOTE — ED Notes (Signed)
Dr. Jeraldine Loots notified of pt k+ being 2.5.

## 2013-06-18 ENCOUNTER — Emergency Department (HOSPITAL_BASED_OUTPATIENT_CLINIC_OR_DEPARTMENT_OTHER)
Admission: EM | Admit: 2013-06-18 | Discharge: 2013-06-19 | Disposition: A | Discharge status: Home / Self Care | Payer: 59 | Attending: Emergency Medicine | Admitting: Emergency Medicine

## 2013-06-18 ENCOUNTER — Encounter (HOSPITAL_BASED_OUTPATIENT_CLINIC_OR_DEPARTMENT_OTHER): Payer: Self-pay | Admitting: Emergency Medicine

## 2013-06-18 DIAGNOSIS — Z792 Long term (current) use of antibiotics: Secondary | ICD-10-CM | POA: Insufficient documentation

## 2013-06-18 DIAGNOSIS — I2510 Atherosclerotic heart disease of native coronary artery without angina pectoris: Secondary | ICD-10-CM | POA: Insufficient documentation

## 2013-06-18 DIAGNOSIS — Z79899 Other long term (current) drug therapy: Secondary | ICD-10-CM | POA: Insufficient documentation

## 2013-06-18 DIAGNOSIS — Z951 Presence of aortocoronary bypass graft: Secondary | ICD-10-CM | POA: Insufficient documentation

## 2013-06-18 DIAGNOSIS — E7800 Pure hypercholesterolemia, unspecified: Secondary | ICD-10-CM | POA: Insufficient documentation

## 2013-06-18 DIAGNOSIS — I10 Essential (primary) hypertension: Secondary | ICD-10-CM | POA: Insufficient documentation

## 2013-06-18 DIAGNOSIS — Z9861 Coronary angioplasty status: Secondary | ICD-10-CM | POA: Insufficient documentation

## 2013-06-18 DIAGNOSIS — Z9889 Other specified postprocedural states: Secondary | ICD-10-CM | POA: Insufficient documentation

## 2013-06-18 DIAGNOSIS — Z7982 Long term (current) use of aspirin: Secondary | ICD-10-CM | POA: Insufficient documentation

## 2013-06-18 LAB — CBC WITH DIFFERENTIAL/PLATELET
BASOS ABS: 0 10*3/uL (ref 0.0–0.1)
BASOS PCT: 0 % (ref 0–1)
Eosinophils Absolute: 0.1 10*3/uL (ref 0.0–0.7)
Eosinophils Relative: 3 % (ref 0–5)
HEMATOCRIT: 40.6 % (ref 39.0–52.0)
Hemoglobin: 14 g/dL (ref 13.0–17.0)
LYMPHS PCT: 37 % (ref 12–46)
Lymphs Abs: 1.7 10*3/uL (ref 0.7–4.0)
MCH: 32 pg (ref 26.0–34.0)
MCHC: 34.5 g/dL (ref 30.0–36.0)
MCV: 92.9 fL (ref 78.0–100.0)
Monocytes Absolute: 0.6 10*3/uL (ref 0.1–1.0)
Monocytes Relative: 12 % (ref 3–12)
NEUTROS ABS: 2.1 10*3/uL (ref 1.7–7.7)
NEUTROS PCT: 47 % (ref 43–77)
PLATELETS: 143 10*3/uL — AB (ref 150–400)
RBC: 4.37 MIL/uL (ref 4.22–5.81)
RDW: 13.2 % (ref 11.5–15.5)
WBC: 4.5 10*3/uL (ref 4.0–10.5)

## 2013-06-18 LAB — COMPREHENSIVE METABOLIC PANEL
ALBUMIN: 4 g/dL (ref 3.5–5.2)
ALK PHOS: 60 U/L (ref 39–117)
ALT: 20 U/L (ref 0–53)
AST: 19 U/L (ref 0–37)
BILIRUBIN TOTAL: 0.3 mg/dL (ref 0.3–1.2)
BUN: 25 mg/dL — ABNORMAL HIGH (ref 6–23)
CHLORIDE: 101 meq/L (ref 96–112)
CO2: 29 meq/L (ref 19–32)
CREATININE: 1.4 mg/dL — AB (ref 0.50–1.35)
Calcium: 9.5 mg/dL (ref 8.4–10.5)
GFR calc Af Amer: 62 mL/min — ABNORMAL LOW (ref >90)
GFR, EST NON AFRICAN AMERICAN: 54 mL/min — AB (ref >90)
Glucose, Bld: 114 mg/dL — ABNORMAL HIGH (ref 70–99)
POTASSIUM: 2.8 meq/L — AB (ref 3.7–5.3)
SODIUM: 145 meq/L (ref 137–147)
Total Protein: 7.1 g/dL (ref 6.0–8.3)

## 2013-06-18 LAB — TROPONIN I: Troponin I: <0.3 ng/mL (ref <0.30)

## 2013-06-18 MED ORDER — AMLODIPINE BESYLATE 5 MG PO TABS
10.0000 mg | ORAL_TABLET | Freq: Once | ORAL | Status: AC
  Administered 2013-06-18: 10 mg via ORAL
  Filled 2013-06-18: qty 2

## 2013-06-18 MED ORDER — POTASSIUM CHLORIDE CRYS ER 20 MEQ PO TBCR
40.0000 meq | EXTENDED_RELEASE_TABLET | Freq: Once | ORAL | Status: AC
Start: 2013-06-18 — End: 2013-06-18
  Administered 2013-06-18: 40 meq via ORAL
  Filled 2013-06-18: qty 2

## 2013-06-18 NOTE — ED Notes (Signed)
Dr. Wyvonnia Dusky is aware of the critical potassium 2.8

## 2013-06-18 NOTE — ED Provider Notes (Signed)
CSN: [id number]     Arrival date & time 06/18/13  2132 History   This chart was scribed for Ezequiel Essex, MD by Maree Erie, ED Scribe. The patient was seen in room MH06/MH06. Patient's care was started at 10:42 PM.     Chief Complaint  Patient presents with  . Hypertension    Patient is a 58 y.o. male presenting with hypertension. The history is provided by the patient. No language interpreter was used.  Hypertension    HPI Comments: Jeffery Conner is a 58 y.o. male who presents to the Emergency Department due to an episode of asymptomatic hypertension that occurred tonight. His BP prior to coming in was 207/117. He checks his BP every few days. He states his BP usually runs less than 951 systolic, typically around 150. He states he has been out of his Norvasc for about a week but has been taking his other BP medication compliantly. He takes them in the morning and has already taken his dose for today of all but the Norvasc. He denies any symptoms, including chest pain, headache, blurred vision, double vision, shortness of breath, abdominal pain or vomiting. He has a past medical history of hypercholesterolemia and CAD. He had a CABG surgery and a bovine aortic valve replacement surgery, both "a few years ago." He is not on anticoagulants currently. He denies previous MI.  His Cardiologist is Dr. Laurell Roof at [facility].   Past Medical History  Diagnosis Date  . Hypertension   . Hypercholesterolemia   . Coronary artery disease    Past Surgical History  Procedure Laterality Date  . Aortic valve replacement    . Coronary artery bypass graft    . Coronary stent placement     No family history on file. History  Substance Use Topics  . Smoking status: Never Smoker   . Smokeless tobacco: Never Used  . Alcohol Use: No    Review of Systems A complete 10 system review of systems was obtained and all systems are negative except as noted in the HPI and PMH.    Allergies  Review of  patient's allergies indicates no known allergies.  Home Medications   Current Outpatient Rx  Name  Route  Sig  Dispense  Refill  . aspirin 325 MG EC tablet   Oral   Take 325 mg by mouth daily.         . benazepril (LOTENSIN) 20 MG tablet   Oral   Take 20 mg by mouth daily.         . chlorthalidone (HYGROTON) 25 MG tablet   Oral   Take 25 mg by mouth daily.         Marland Kitchen labetalol (NORMODYNE) 200 MG tablet   Oral   Take 200 mg by mouth 2 (two) times daily.         . Methylcobalamin (B-12) 5000 MCG TBDP   Oral   Take 1 tablet by mouth 2 (two) times a week. Take on Monday and Thursday         . potassium chloride SA (K-DUR,KLOR-CON) 20 MEQ tablet   Oral   Take 20 mEq by mouth 2 (two) times daily.         . pravastatin (PRAVACHOL) 40 MG tablet   Oral   Take 40 mg by mouth daily.         Marland Kitchen amLODipine (NORVASC) 10 MG tablet   Oral   Take 10 mg by mouth daily.         Marland Kitchen  amLODipine (NORVASC) 10 MG tablet   Oral   Take 1 tablet (10 mg total) by mouth once.   30 tablet   0   . bacitracin ointment   Topical   Apply topically 2 (two) times daily.   120 g   2   . cephALEXin (KEFLEX) 500 MG capsule   Oral   Take 500 mg by mouth.         . Fesoterodine Fumarate (TOVIAZ) 8 MG TB24   Oral   Take 8 mg by mouth daily.         Marland Kitchen sulfamethoxazole-trimethoprim (BACTRIM DS) 800-160 MG per tablet   Oral   Take 1 tablet by mouth.          Triage Vitals: BP 216/120  Pulse 60  Temp(Src) 97.6 F (36.4 C) (Oral)  Resp 20  Ht 6' (1.829 m)  Wt 230 lb (104.327 kg)  BMI 31.19 kg/m2  SpO2 97%  Physical Exam  Nursing note and vitals reviewed. Constitutional: He is oriented to person, place, and time. He appears well-developed and well-nourished. No distress.  HENT:  Head: Normocephalic and atraumatic.  Eyes: EOM are normal.  Neck: Neck supple. No tracheal deviation present.  Cardiovascular: Normal rate, regular rhythm and normal heart sounds.   No  murmur heard. Pulmonary/Chest: Effort normal. No respiratory distress. He has no wheezes. He has no rales.  Abdominal: Soft. There is no tenderness. There is no rebound and no guarding.  Musculoskeletal: Normal range of motion.  Neurological: He is alert and oriented to person, place, and time.  CN 2-12 intact, no ataxia on finger to nose, no nystagmus, 5/5 strength throughout, no pronator drift, Romberg negative, normal gait.  Skin: Skin is warm and dry.  Psychiatric: He has a normal mood and affect. His behavior is normal.    ED Course  Procedures (including critical care time)  DIAGNOSTIC STUDIES: Oxygen Saturation is 97% on room air, normal by my interpretation.    COORDINATION OF CARE: 10:43 PM -BP 193/114 upon recheck. Will order CBC, CMP and Troponin I. Patient verbalizes understanding and agrees with treatment plan.   Filed Vitals:   06/18/13 2315 06/18/13 2330 06/18/13 2345 06/19/13 0000  BP: 194/109 188/108 188/108 183/114  Pulse: 54 51 50 50  Temp:      TempSrc:      Resp:      Height:      Weight:      SpO2: 92% 95% 94% 95%   Labs Review Labs Reviewed  CBC WITH DIFFERENTIAL - Abnormal; Notable for the following:    Platelets 143 (*)    All other components within normal limits  COMPREHENSIVE METABOLIC PANEL - Abnormal; Notable for the following:    Potassium 2.8 (*)    Glucose, Bld 114 (*)    BUN 25 (*)    Creatinine, Ser 1.40 (*)    GFR calc non Af Amer 54 (*)    GFR calc Af Amer 62 (*)    All other components within normal limits  TROPONIN I  MAGNESIUM   Imaging Review No results found.  EKG Interpretation    Date/Time:  Saturday June 18 2013 21:54:36 EST Ventricular Rate:  53 PR Interval:  196 QRS Duration: 108 QT Interval:  406 QTC Calculation: 380 R Axis:   -27 Text Interpretation:  Sinus bradycardia Left ventricular hypertrophy Nonspecific T wave abnormality Abnormal ECG No significant change was found Confirmed by Manus Gunning  MD, Adiyah Lame  ([id number]) on 06/18/2013 9:58:46  PM            MDM   1. Hypertension    Elevated blood pressure at home tonight of 216/120. Out of Norvasc for the past week. Taking his other blood pressure medications. Denies any headache, vision change, chest pain, shortness of breath.  Neurological exam is benign. Mildly elevated at 1.4 from baseline of 1.1. Hypokalemia of 2.8. Lab review shows this appears to be chronic problem for patient. Patient states he takes potassium supplements twice daily at home and states compliance. 40 meQ given here. Magnesium normal.  BP improved to 188/108. Denies chest pain, back pain, headache, vision change. Amlodipine refilled.  Followup with PCP this week.  Stressed compliance with medications.  I personally performed the services described in this documentation, which was scribed in my presence. The recorded information has been reviewed and is accurate.    Ezequiel Essex, MD 06/19/13 [id number]

## 2013-06-18 NOTE — ED Notes (Signed)
Pt has been educated on lab work and his results.  No questions voiced.

## 2013-06-18 NOTE — ED Notes (Signed)
States he checked his Blood pressure today and it was elevated. Has been out of norvasc x 1 week. Triage b/p 216/120

## 2013-06-19 LAB — MAGNESIUM: MAGNESIUM: 2.4 mg/dL (ref 1.5–2.5)

## 2013-06-19 MED ORDER — AMLODIPINE BESYLATE 10 MG PO TABS: 10.0000 mg | ORAL_TABLET | Freq: Once | ORAL | Status: AC

## 2013-06-19 NOTE — Discharge Instructions (Signed)
Arterial Hypertension Followup with your Dr. this week for a recheck of her blood pressure, potassium, and kidney function. Return to the ED if you develop new or worsening symptoms. Arterial hypertension (high blood pressure) is a condition of elevated pressure in your blood vessels. Hypertension over a long period of time is a risk factor for strokes, heart attacks, and heart failure. It is also the leading cause of kidney (renal) failure.  CAUSES   In Adults -- Over 90% of all hypertension has no known cause. This is called essential or primary hypertension. In the other 10% of people with hypertension, the increase in blood pressure is caused by another disorder. This is called secondary hypertension. Important causes of secondary hypertension are:  Heavy alcohol use.  Obstructive sleep apnea.  Hyperaldosterosim (Conn's syndrome).  Steroid use.  Chronic kidney failure.  Hyperparathyroidism.  Medications.  Renal artery stenosis.  Pheochromocytoma.  Cushing's disease.  Coarctation of the aorta.  Scleroderma renal crisis.  Licorice (in excessive amounts).  Drugs (cocaine, methamphetamine). Your caregiver can explain any items above that apply to you.  In Children -- Secondary hypertension is more common and should always be considered.  Pregnancy -- Few women of childbearing age have high blood pressure. However, up to 10% of them develop hypertension of pregnancy. Generally, this will not harm the woman. It may be a sign of 3 complications of pregnancy: preeclampsia, HELLP syndrome, and eclampsia. Follow up and control with medication is necessary. SYMPTOMS   This condition normally does not produce any noticeable symptoms. It is usually found during a routine exam.  Malignant hypertension is a late problem of high blood pressure. It may have the following symptoms:  Headaches.  Blurred vision.  End-organ damage (this means your kidneys, heart, lungs, and other  organs are being damaged).  Stressful situations can increase the blood pressure. If a person with normal blood pressure has their blood pressure go up while being seen by their caregiver, this is often termed "white coat hypertension." Its importance is not known. It may be related with eventually developing hypertension or complications of hypertension.  Hypertension is often confused with mental tension, stress, and anxiety. DIAGNOSIS  The diagnosis is made by 3 separate blood pressure measurements. They are taken at least 1 week apart from each other. If there is organ damage from hypertension, the diagnosis may be made without repeat measurements. Hypertension is usually identified by having blood pressure readings:  Above 140/90 mmHg measured in both arms, at 3 separate times, over a couple weeks.  Over 130/80 mmHg should be considered a risk factor and may require treatment in patients with diabetes. Blood pressure readings over 120/80 mmHg are called "pre-hypertension" even in non-diabetic patients. To get a true blood pressure measurement, use the following guidelines. Be aware of the factors that can alter blood pressure readings.  Take measurements at least 1 hour after caffeine.  Take measurements 30 minutes after smoking and without any stress. This is another reason to quit smoking  it raises your blood pressure.  Use a proper cuff size. Ask your caregiver if you are not sure about your cuff size.  Most home blood pressure cuffs are automatic. They will measure systolic and diastolic pressures. The systolic pressure is the pressure reading at the start of sounds. Diastolic pressure is the pressure at which the sounds disappear. If you are elderly, measure pressures in multiple postures. Try sitting, lying or standing.  Sit at rest for a minimum of 5 minutes  before taking measurements.  You should not be on any medications like decongestants. These are found in many cold  medications.  Record your blood pressure readings and review them with your caregiver. If you have hypertension:  Your caregiver may do tests to be sure you do not have secondary hypertension (see "causes" above).  Your caregiver may also look for signs of metabolic syndrome. This is also called Syndrome X or Insulin Resistance Syndrome. You may have this syndrome if you have type 2 diabetes, abdominal obesity, and abnormal blood lipids in addition to hypertension.  Your caregiver will take your medical and family history and perform a physical exam.  Diagnostic tests may include blood tests (for glucose, cholesterol, potassium, and kidney function), a urinalysis, or an EKG. Other tests may also be necessary depending on your condition. PREVENTION  There are important lifestyle issues that you can adopt to reduce your chance of developing hypertension:  Maintain a normal weight.  Limit the amount of salt (sodium) in your diet.  Exercise often.  Limit alcohol intake.  Get enough potassium in your diet. Discuss specific advice with your caregiver.  Follow a DASH diet (dietary approaches to stop hypertension). This diet is rich in fruits, vegetables, and low-fat dairy products, and avoids certain fats. PROGNOSIS  Essential hypertension cannot be cured. Lifestyle changes and medical treatment can lower blood pressure and reduce complications. The prognosis of secondary hypertension depends on the underlying cause. Many people whose hypertension is controlled with medicine or lifestyle changes can live a normal, healthy life.  RISKS AND COMPLICATIONS  While high blood pressure alone is not an illness, it often requires treatment due to its short- and long-term effects on many organs. Hypertension increases your risk for:  CVAs or strokes (cerebrovascular accident).  Heart failure due to chronically high blood pressure (hypertensive cardiomyopathy).  Heart attack (myocardial  infarction).  Damage to the retina (hypertensive retinopathy).  Kidney failure (hypertensive nephropathy). Your caregiver can explain list items above that apply to you. Treatment of hypertension can significantly reduce the risk of complications. TREATMENT   For overweight patients, weight loss and regular exercise are recommended. Physical fitness lowers blood pressure.  Mild hypertension is usually treated with diet and exercise. A diet rich in fruits and vegetables, fat-free dairy products, and foods low in fat and salt (sodium) can help lower blood pressure. Decreasing salt intake decreases blood pressure in a 1/3 of people.  Stop smoking if you are a smoker. The steps above are highly effective in reducing blood pressure. While these actions are easy to suggest, they are difficult to achieve. Most patients with moderate or severe hypertension end up requiring medications to bring their blood pressure down to a normal level. There are several classes of medications for treatment. Blood pressure pills (antihypertensives) will lower blood pressure by their different actions. Lowering the blood pressure by 10 mmHg may decrease the risk of complications by as much as 25%. The goal of treatment is effective blood pressure control. This will reduce your risk for complications. Your caregiver will help you determine the best treatment for you according to your lifestyle. What is excellent treatment for one person, may not be for you. HOME CARE INSTRUCTIONS   Do not smoke.  Follow the lifestyle changes outlined in the "Prevention" section.  If you are on medications, follow the directions carefully. Blood pressure medications must be taken as prescribed. Skipping doses reduces their benefit. It also puts you at risk for problems.  Follow up  with your caregiver, as directed.  If you are asked to monitor your blood pressure at home, follow the guidelines in the "Diagnosis" section above. SEEK  MEDICAL CARE IF:   You think you are having medication side effects.  You have recurrent headaches or lightheadedness.  You have swelling in your ankles.  You have trouble with your vision. SEEK IMMEDIATE MEDICAL CARE IF:   You have sudden onset of chest pain or pressure, difficulty breathing, or other symptoms of a heart attack.  You have a severe headache.  You have symptoms of a stroke (such as sudden weakness, difficulty speaking, difficulty walking). MAKE SURE YOU:   Understand these instructions.  Will watch your condition.  Will get help right away if you are not doing well or get worse. Document Released: 05/19/2005 Document Revised: 08/11/2011 Document Reviewed: 12/17/2006 [facility] Patient Information 2014 [place].

## 2013-12-16 DIAGNOSIS — N529 Male erectile dysfunction, unspecified: Secondary | ICD-10-CM | POA: Insufficient documentation

## 2015-01-21 ENCOUNTER — Emergency Department (HOSPITAL_BASED_OUTPATIENT_CLINIC_OR_DEPARTMENT_OTHER)
Admission: EM | Admit: 2015-01-21 | Discharge: 2015-01-21 | Disposition: A | Discharge status: Home / Self Care | Payer: 59 | Attending: Emergency Medicine | Admitting: Emergency Medicine

## 2015-01-21 ENCOUNTER — Encounter (HOSPITAL_BASED_OUTPATIENT_CLINIC_OR_DEPARTMENT_OTHER): Payer: Self-pay | Admitting: Emergency Medicine

## 2015-01-21 DIAGNOSIS — Z792 Long term (current) use of antibiotics: Secondary | ICD-10-CM | POA: Diagnosis not present

## 2015-01-21 DIAGNOSIS — L509 Urticaria, unspecified: Secondary | ICD-10-CM

## 2015-01-21 DIAGNOSIS — I10 Essential (primary) hypertension: Secondary | ICD-10-CM | POA: Diagnosis not present

## 2015-01-21 DIAGNOSIS — Z79899 Other long term (current) drug therapy: Secondary | ICD-10-CM | POA: Insufficient documentation

## 2015-01-21 DIAGNOSIS — E876 Hypokalemia: Secondary | ICD-10-CM | POA: Diagnosis not present

## 2015-01-21 DIAGNOSIS — L03116 Cellulitis of left lower limb: Secondary | ICD-10-CM

## 2015-01-21 DIAGNOSIS — R21 Rash and other nonspecific skin eruption: Secondary | ICD-10-CM | POA: Diagnosis present

## 2015-01-21 DIAGNOSIS — I2510 Atherosclerotic heart disease of native coronary artery without angina pectoris: Secondary | ICD-10-CM | POA: Diagnosis not present

## 2015-01-21 DIAGNOSIS — E780 Pure hypercholesterolemia: Secondary | ICD-10-CM | POA: Diagnosis not present

## 2015-01-21 DIAGNOSIS — Z7982 Long term (current) use of aspirin: Secondary | ICD-10-CM | POA: Insufficient documentation

## 2015-01-21 LAB — CBC WITH DIFFERENTIAL/PLATELET
Basophils Absolute: 0 10*3/uL (ref 0.0–0.1)
Basophils Relative: 0 % (ref 0–1)
EOS ABS: 0.5 10*3/uL (ref 0.0–0.7)
EOS PCT: 5 % (ref 0–5)
HCT: 43.6 % (ref 39.0–52.0)
Hemoglobin: 15.2 g/dL (ref 13.0–17.0)
LYMPHS ABS: 0.8 10*3/uL (ref 0.7–4.0)
LYMPHS PCT: 8 % — AB (ref 12–46)
MCH: 32.1 pg (ref 26.0–34.0)
MCHC: 34.9 g/dL (ref 30.0–36.0)
MCV: 92.2 fL (ref 78.0–100.0)
MONO ABS: 0.7 10*3/uL (ref 0.1–1.0)
MONOS PCT: 6 % (ref 3–12)
Neutro Abs: 8.5 10*3/uL — ABNORMAL HIGH (ref 1.7–7.7)
Neutrophils Relative %: 81 % — ABNORMAL HIGH (ref 43–77)
PLATELETS: 150 10*3/uL (ref 150–400)
RBC: 4.73 MIL/uL (ref 4.22–5.81)
RDW: 13.2 % (ref 11.5–15.5)
WBC: 10.5 10*3/uL (ref 4.0–10.5)

## 2015-01-21 LAB — COMPREHENSIVE METABOLIC PANEL
ALBUMIN: 4.3 g/dL (ref 3.5–5.0)
ALT: 25 U/L (ref 17–63)
AST: 21 U/L (ref 15–41)
Alkaline Phosphatase: 68 U/L (ref 38–126)
Anion gap: 11 (ref 5–15)
BUN: 26 mg/dL — AB (ref 6–20)
CHLORIDE: 104 mmol/L (ref 101–111)
CO2: 27 mmol/L (ref 22–32)
CREATININE: 1.76 mg/dL — AB (ref 0.61–1.24)
Calcium: 9.4 mg/dL (ref 8.9–10.3)
GFR calc Af Amer: 46 mL/min — ABNORMAL LOW (ref >60)
GFR calc non Af Amer: 40 mL/min — ABNORMAL LOW (ref >60)
GLUCOSE: 119 mg/dL — AB (ref 65–99)
POTASSIUM: 2.8 mmol/L — AB (ref 3.5–5.1)
Sodium: 142 mmol/L (ref 135–145)
Total Bilirubin: 1.1 mg/dL (ref 0.3–1.2)
Total Protein: 6.9 g/dL (ref 6.5–8.1)

## 2015-01-21 MED ORDER — POTASSIUM CHLORIDE ER 10 MEQ PO TBCR: 20.0000 meq | EXTENDED_RELEASE_TABLET | Freq: Two times a day (BID) | ORAL | Status: AC

## 2015-01-21 MED ORDER — AMOXICILLIN-POT CLAVULANATE 500-125 MG PO TABS: 1.0000 | ORAL_TABLET | Freq: Three times a day (TID) | ORAL | Status: DC

## 2015-01-21 MED ORDER — POTASSIUM CHLORIDE 10 MEQ/100ML IV SOLN
10.0000 meq | Freq: Once | INTRAVENOUS | Status: AC
  Administered 2015-01-21: 10 meq via INTRAVENOUS
  Filled 2015-01-21: qty 100

## 2015-01-21 MED ORDER — PREDNISONE 10 MG PO TABS: 20.0000 mg | ORAL_TABLET | Freq: Two times a day (BID) | ORAL | Status: DC

## 2015-01-21 MED ORDER — DIPHENHYDRAMINE HCL 50 MG/ML IJ SOLN
25.0000 mg | Freq: Once | INTRAMUSCULAR | Status: AC
  Administered 2015-01-21: 25 mg via INTRAVENOUS
  Filled 2015-01-21: qty 1

## 2015-01-21 MED ORDER — METHYLPREDNISOLONE SODIUM SUCC 125 MG IJ SOLR
125.0000 mg | Freq: Once | INTRAMUSCULAR | Status: AC
  Administered 2015-01-21: 125 mg via INTRAVENOUS
  Filled 2015-01-21: qty 2

## 2015-01-21 MED ORDER — DEXTROSE 5 % IV SOLN
1.0000 g | Freq: Once | INTRAVENOUS | Status: AC
  Administered 2015-01-21: 1 g via INTRAVENOUS

## 2015-01-21 MED ORDER — CEFTRIAXONE SODIUM 1 G IJ SOLR
INTRAMUSCULAR | Status: AC
  Filled 2015-01-21: qty 10

## 2015-01-21 MED ORDER — POTASSIUM CHLORIDE CRYS ER 20 MEQ PO TBCR
40.0000 meq | EXTENDED_RELEASE_TABLET | Freq: Once | ORAL | Status: AC
  Administered 2015-01-21: 40 meq via ORAL
  Filled 2015-01-21: qty 2

## 2015-01-21 NOTE — ED Notes (Addendum)
Patient reports all over itching that began last night.  Reports he believes he also has cellulitis to left leg.  Reports he also thinks he needs his potassium level checked due to "something feels funny in my chest sometimes". Reports he was told this was related to potassium in the past. Denies chest pain or abnormal chest feelings at present.

## 2015-01-21 NOTE — Discharge Instructions (Signed)
Augmentin, potassium, and prednisone as prescribed.  Take Benadryl 25 mg every 6 hours as needed for itching.  Return to ER if symptoms significantly worsen or change.   Cellulitis Cellulitis is an infection of the skin and the tissue beneath it. The infected area is usually red and tender. Cellulitis occurs most often in the arms and lower legs.  CAUSES  Cellulitis is caused by bacteria that enter the skin through cracks or cuts in the skin. The most common types of bacteria that cause cellulitis are staphylococci and streptococci. SIGNS AND SYMPTOMS   Redness and warmth.  Swelling.  Tenderness or pain.  Fever. DIAGNOSIS  Your health care provider can usually determine what is wrong based on a physical exam. Blood tests may also be done. TREATMENT  Treatment usually involves taking an antibiotic medicine. HOME CARE INSTRUCTIONS   Take your antibiotic medicine as directed by your health care provider. Finish the antibiotic even if you start to feel better.  Keep the infected arm or leg elevated to reduce swelling.  Apply a warm cloth to the affected area up to 4 times per day to relieve pain.  Take medicines only as directed by your health care provider.  Keep all follow-up visits as directed by your health care provider. SEEK MEDICAL CARE IF:   You notice red streaks coming from the infected area.  Your red area gets larger or turns dark in color.  Your bone or joint underneath the infected area becomes painful after the skin has healed.  Your infection returns in the same area or another area.  You notice a swollen bump in the infected area.  You develop new symptoms.  You have a fever. SEEK IMMEDIATE MEDICAL CARE IF:   You feel very sleepy.  You develop vomiting or diarrhea.  You have a general ill feeling (malaise) with muscle aches and pains. MAKE SURE YOU:   Understand these instructions.  Will watch your condition.  Will get help right away if you  are not doing well or get worse. Document Released: 02/26/2005 Document Revised: 10/03/2013 Document Reviewed: 08/04/2011 [facility] Patient Information 2015 [place], [place]. This information is not intended to replace advice given to you by your health care provider. Make sure you discuss any questions you have with your health care provider.  Hives Hives are itchy, red, swollen areas of the skin. They can vary in size and location on your body. Hives can come and go for hours or several days (acute hives) or for several weeks (chronic hives). Hives do not spread from person to person (noncontagious). They may get worse with scratching, exercise, and emotional stress. CAUSES   Allergic reaction to food, additives, or drugs.  Infections, including the common cold.  Illness, such as vasculitis, lupus, or thyroid disease.  Exposure to sunlight, heat, or cold.  Exercise.  Stress.  Contact with chemicals. SYMPTOMS   Red or white swollen patches on the skin. The patches may change size, shape, and location quickly and repeatedly.  Itching.  Swelling of the hands, feet, and face. This may occur if hives develop deeper in the skin. DIAGNOSIS  Your caregiver can usually tell what is wrong by performing a physical exam. Skin or blood tests may also be done to determine the cause of your hives. In some cases, the cause cannot be determined. TREATMENT  Mild cases usually get better with medicines such as antihistamines. Severe cases may require an emergency epinephrine injection. If the cause of your hives is  known, treatment includes avoiding that trigger.  HOME CARE INSTRUCTIONS   Avoid causes that trigger your hives.  Take antihistamines as directed by your caregiver to reduce the severity of your hives. Non-sedating or low-sedating antihistamines are usually recommended. Do not drive while taking an antihistamine.  Take any other medicines prescribed for itching as directed by your  caregiver.  Wear loose-fitting clothing.  Keep all follow-up appointments as directed by your caregiver. SEEK MEDICAL CARE IF:   You have persistent or severe itching that is not relieved with medicine.  You have painful or swollen joints. SEEK IMMEDIATE MEDICAL CARE IF:   You have a fever.  Your tongue or lips are swollen.  You have trouble breathing or swallowing.  You feel tightness in the throat or chest.  You have abdominal pain. These problems may be the first sign of a life-threatening allergic reaction. Call your local emergency services (911 in [place] MAKE SURE YOU:   Understand these instructions.  Will watch your condition.  Will get help right away if you are not doing well or get worse. Document Released: 05/19/2005 Document Revised: 05/24/2013 Document Reviewed: 08/12/2011 [facility] Patient Information 2015 [place], [place]. This information is not intended to replace advice given to you by your health care provider. Make sure you discuss any questions you have with your health care provider.  Hypokalemia Hypokalemia means that the amount of potassium in the blood is lower than normal.Potassium is a chemical, called an electrolyte, that helps regulate the amount of fluid in the body. It also stimulates muscle contraction and helps nerves function properly.Most of the body's potassium is inside of cells, and only a very small amount is in the blood. Because the amount in the blood is so small, minor changes can be life-threatening. CAUSES  Antibiotics.  Diarrhea or vomiting.  Using laxatives too much, which can cause diarrhea.  Chronic kidney disease.  Water pills (diuretics).  Eating disorders (bulimia).  Low magnesium level.  Sweating a lot. SIGNS AND SYMPTOMS  Weakness.  Constipation.  Fatigue.  Muscle cramps.  Mental confusion.  Skipped heartbeats or irregular heartbeat (palpitations).  Tingling or numbness. DIAGNOSIS  Your health  care provider can diagnose hypokalemia with blood tests. In addition to checking your potassium level, your health care provider may also check other lab tests. TREATMENT Hypokalemia can be treated with potassium supplements taken by mouth or adjustments in your current medicines. If your potassium level is very low, you may need to get potassium through a vein (IV) and be monitored in the hospital. A diet high in potassium is also helpful. Foods high in potassium are:  Nuts, such as peanuts and pistachios.  Seeds, such as sunflower seeds and pumpkin seeds.  Peas, lentils, and lima beans.  Whole grain and bran cereals and breads.  Fresh fruit and vegetables, such as apricots, avocado, bananas, cantaloupe, kiwi, oranges, tomatoes, asparagus, and potatoes.  Orange and tomato juices.  Red meats.  Fruit yogurt. HOME CARE INSTRUCTIONS  Take all medicines as prescribed by your health care provider.  Maintain a healthy diet by including nutritious food, such as fruits, vegetables, nuts, whole grains, and lean meats.  If you are taking a laxative, be sure to follow the directions on the label. SEEK MEDICAL CARE IF:  Your weakness gets worse.  You feel your heart pounding or racing.  You are vomiting or having diarrhea.  You are diabetic and having trouble keeping your blood glucose in the normal range. [place]  IF:  You have chest pain, shortness of breath, or dizziness.  You are vomiting or having diarrhea for more than 2 days.  You faint. MAKE SURE YOU:   Understand these instructions.  Will watch your condition.  Will get help right away if you are not doing well or get worse. Document Released: 05/19/2005 Document Revised: 03/09/2013 Document Reviewed: 11/19/2012 [facility] Patient Information 2015 [place], [place]. This information is not intended to replace advice given to you by your health care provider. Make sure you discuss any questions you have  with your health care provider.

## 2015-01-21 NOTE — ED Provider Notes (Signed)
CSN: [id number]   Arrival date & time 01/21/15 1730  History  This chart was scribed for Veryl Speak, MD by Altamease Oiler, ED Scribe. This patient was seen in room MH08/MH08 and the patient's care was started at 6:10 PM.  Chief Complaint  Patient presents with  . generalized rash, leg erythema, "low potassium"     HPI The history is provided by the patient. No language interpreter was used.   Jeffery Conner is a 59 y.o. male who presents to the Emergency Department complaining of a diffuse and pruritic rash with gradual onset last night. The pt was visiting [place] where he stayed in a hotel overnight when he noticed the rash and notes that it has increased since initial onset.  Also complains of swelling and warmth at the left leg that he associates with recurrent cellulitis and a "dull sensation" in the left chest that he associates with hypokalemia because he has had similar sensations in the past.  Pt denies fever, cough, vomiting, and diarrhea. No anticoagulation.   Past Medical History  Diagnosis Date  . Hypertension   . Hypercholesterolemia   . Coronary artery disease     Past Surgical History  Procedure Laterality Date  . Aortic valve replacement    . Coronary artery bypass graft    . Coronary stent placement      History reviewed. No pertinent family history.  Social History  Substance Use Topics  . Smoking status: Never Smoker   . Smokeless tobacco: Never Used  . Alcohol Use: No     Review of Systems 10 Systems reviewed and all are negative for acute change except as noted in the HPI.  Home Medications   Prior to Admission medications   Medication Sig Start Date End Date Taking? Authorizing Provider  atorvastatin (LIPITOR) 10 MG tablet Take by mouth daily.   Yes Historical Provider, MD  potassium chloride SA (K-DUR,KLOR-CON) 20 MEQ tablet Take 20 mEq by mouth 2 (two) times daily.   Yes Historical Provider, MD  amLODipine (NORVASC) 10 MG tablet Take 10 mg by  mouth daily.    Historical Provider, MD  amLODipine (NORVASC) 10 MG tablet Take 1 tablet (10 mg total) by mouth once. 06/19/13   Ezequiel Essex, MD  aspirin 325 MG EC tablet Take 81 mg by mouth daily.     Historical Provider, MD  bacitracin ointment Apply topically 2 (two) times daily. 02/29/12   Noemi Chapel, MD  benazepril (LOTENSIN) 20 MG tablet Take 20 mg by mouth daily.    Historical Provider, MD  cephALEXin (KEFLEX) 500 MG capsule Take 500 mg by mouth.    Historical Provider, MD  chlorthalidone (HYGROTON) 25 MG tablet Take 25 mg by mouth daily.    Historical Provider, MD  Fesoterodine Fumarate (TOVIAZ) 8 MG TB24 Take 8 mg by mouth daily.    Historical Provider, MD  labetalol (NORMODYNE) 200 MG tablet Take 200 mg by mouth 2 (two) times daily.    Historical Provider, MD  Methylcobalamin (B-12) 5000 MCG TBDP Take 1 tablet by mouth 2 (two) times a week. Take on Monday and Thursday    Historical Provider, MD  potassium chloride (K-DUR,KLOR-CON) 10 MEQ tablet Take by mouth once.    Historical Provider, MD  pravastatin (PRAVACHOL) 40 MG tablet Take 40 mg by mouth daily.    Historical Provider, MD  sulfamethoxazole-trimethoprim (BACTRIM DS) 800-160 MG per tablet Take 1 tablet by mouth.    Historical Provider, MD    Allergies  Review of patient's allergies indicates no known allergies.  Triage Vitals: BP 111/72 mmHg  Pulse 88  Temp(Src) 99.1 F (37.3 C) (Oral)  Resp 18  Ht 6' (1.829 m)  Wt 235 lb (106.595 kg)  BMI 31.86 kg/m2  SpO2 96%  Physical Exam  Constitutional: He is oriented to person, place, and time. He appears well-developed and well-nourished.  HENT:  Head: Normocephalic and atraumatic.  Eyes: EOM are normal.  Neck: Normal range of motion.  Cardiovascular: Normal rate, regular rhythm, normal heart sounds and intact distal pulses.   Pulmonary/Chest: Effort normal and breath sounds normal. No respiratory distress.  Abdominal: Soft. He exhibits no distension. There is no  tenderness.  Musculoskeletal: Normal range of motion.  The LLE has redness, warmth, and tenderness in a stocking distribution. There is no calf tenderness and Holman's sign is absent. Distal pulses are easily palpable.   Neurological: He is alert and oriented to person, place, and time.  Skin: Skin is warm and dry.  There is a generalized urticarial rash to the arms, legs, and torso.   Psychiatric: He has a normal mood and affect. Judgment normal.  Nursing note and vitals reviewed.    ED Course  Procedures   DIAGNOSTIC STUDIES: Oxygen Saturation is 96% on RA, normal by my interpretation.    COORDINATION OF CARE: 6:14 PM Discussed treatment plan which includes lab work, EKG, Rocephin, Solu-medrol, and Benadryl with pt at bedside and pt agreed to plan.  8:10 PM I re-evaluated the patient and provided an update on the results of his lab work.   Labs Reviewed  COMPREHENSIVE METABOLIC PANEL - Abnormal; Notable for the following:    Potassium 2.8 (*)    Glucose, Bld 119 (*)    BUN 26 (*)    Creatinine, Ser 1.76 (*)    GFR calc non Af Amer 40 (*)    GFR calc Af Amer 46 (*)    All other components within normal limits  CBC WITH DIFFERENTIAL/PLATELET - Abnormal; Notable for the following:    Neutrophils Relative % 81 (*)    Neutro Abs 8.5 (*)    Lymphocytes Relative 8 (*)    All other components within normal limits    Imaging Review No results found.  EKG Interpretation  Date/Time:  Sunday January 21 2015 18:27:42 EDT Ventricular Rate:  79 PR Interval:  208 QRS Duration: 108 QT Interval:  378 QTC Calculation: 433 R Axis:   -26 Text Interpretation:  Normal sinus rhythm Left ventricular hypertrophy Abnormal QRS-T angle, consider primary T wave abnormality Abnormal ECG Confirmed by Loralai Eisman  MD, Traveion Ruddock ([id number]) on 01/21/2015 8:20:33 PM       MDM   Final diagnoses:  None     Patient is a 59 year old male with history of valve replacement. He presents with multiple  complaints. He believes his potassium is low as he has a strange sensation in his chest. His potassium returned at 2.8 and was worked lay's both intravenously and orally. He is also noted to have a cellulitis of the left lower extremity. He is a febrile and has no white count. He was given ceftriaxone and will be discharged with Augmentin. He also has some sort of urticarial rash to his arms, legs, and torso. This was treated with steroids and an histamine's while he was here. He will now be discharged with potassium replacement, oral antibiotics, and when necessary return.   I personally performed the services described in this documentation, which was scribed in  my presence. The recorded information has been reviewed and is accurate.    Veryl Speak, MD 01/21/15 2104

## 2016-10-24 ENCOUNTER — Emergency Department (HOSPITAL_BASED_OUTPATIENT_CLINIC_OR_DEPARTMENT_OTHER)
Admission: EM | Admit: 2016-10-24 | Discharge: 2016-10-24 | Disposition: A | Discharge status: Home / Self Care | Payer: BLUE CROSS/BLUE SHIELD | Attending: Emergency Medicine | Admitting: Emergency Medicine

## 2016-10-24 ENCOUNTER — Emergency Department (HOSPITAL_BASED_OUTPATIENT_CLINIC_OR_DEPARTMENT_OTHER): Payer: BLUE CROSS/BLUE SHIELD

## 2016-10-24 ENCOUNTER — Encounter (HOSPITAL_BASED_OUTPATIENT_CLINIC_OR_DEPARTMENT_OTHER): Payer: Self-pay | Admitting: *Deleted

## 2016-10-24 DIAGNOSIS — Z955 Presence of coronary angioplasty implant and graft: Secondary | ICD-10-CM | POA: Insufficient documentation

## 2016-10-24 DIAGNOSIS — Z7982 Long term (current) use of aspirin: Secondary | ICD-10-CM | POA: Insufficient documentation

## 2016-10-24 DIAGNOSIS — I2510 Atherosclerotic heart disease of native coronary artery without angina pectoris: Secondary | ICD-10-CM | POA: Insufficient documentation

## 2016-10-24 DIAGNOSIS — E119 Type 2 diabetes mellitus without complications: Secondary | ICD-10-CM | POA: Insufficient documentation

## 2016-10-24 DIAGNOSIS — Z952 Presence of prosthetic heart valve: Secondary | ICD-10-CM | POA: Insufficient documentation

## 2016-10-24 DIAGNOSIS — I25709 Atherosclerosis of coronary artery bypass graft(s), unspecified, with unspecified angina pectoris: Secondary | ICD-10-CM | POA: Diagnosis not present

## 2016-10-24 DIAGNOSIS — Z7984 Long term (current) use of oral hypoglycemic drugs: Secondary | ICD-10-CM | POA: Insufficient documentation

## 2016-10-24 DIAGNOSIS — R079 Chest pain, unspecified: Secondary | ICD-10-CM | POA: Diagnosis not present

## 2016-10-24 DIAGNOSIS — I10 Essential (primary) hypertension: Secondary | ICD-10-CM | POA: Diagnosis not present

## 2016-10-24 DIAGNOSIS — Z79899 Other long term (current) drug therapy: Secondary | ICD-10-CM | POA: Diagnosis not present

## 2016-10-24 HISTORY — DX: Type 2 diabetes mellitus without complications: E11.9

## 2016-10-24 LAB — CBC WITH DIFFERENTIAL/PLATELET
BASOS ABS: 0 10*3/uL (ref 0.0–0.1)
Basophils Relative: 0 %
Eosinophils Absolute: 0.1 10*3/uL (ref 0.0–0.7)
Eosinophils Relative: 2 %
HEMATOCRIT: 41.9 % (ref 39.0–52.0)
Hemoglobin: 14.4 g/dL (ref 13.0–17.0)
LYMPHS ABS: 1.6 10*3/uL (ref 0.7–4.0)
LYMPHS PCT: 32 %
MCH: 32.4 pg (ref 26.0–34.0)
MCHC: 34.4 g/dL (ref 30.0–36.0)
MCV: 94.2 fL (ref 78.0–100.0)
MONO ABS: 0.6 10*3/uL (ref 0.1–1.0)
Monocytes Relative: 13 %
NEUTROS ABS: 2.6 10*3/uL (ref 1.7–7.7)
Neutrophils Relative %: 53 %
Platelets: 139 10*3/uL — ABNORMAL LOW (ref 150–400)
RBC: 4.45 MIL/uL (ref 4.22–5.81)
RDW: 12.9 % (ref 11.5–15.5)
WBC: 4.9 10*3/uL (ref 4.0–10.5)

## 2016-10-24 LAB — COMPREHENSIVE METABOLIC PANEL
ALT: 24 U/L (ref 17–63)
AST: 18 U/L (ref 15–41)
Albumin: 4.4 g/dL (ref 3.5–5.0)
Alkaline Phosphatase: 91 U/L (ref 38–126)
Anion gap: 8 (ref 5–15)
BILIRUBIN TOTAL: 0.5 mg/dL (ref 0.3–1.2)
BUN: 17 mg/dL (ref 6–20)
CO2: 28 mmol/L (ref 22–32)
CREATININE: 0.81 mg/dL (ref 0.61–1.24)
Calcium: 9.1 mg/dL (ref 8.9–10.3)
Chloride: 106 mmol/L (ref 101–111)
Glucose, Bld: 104 mg/dL — ABNORMAL HIGH (ref 65–99)
Potassium: 3.3 mmol/L — ABNORMAL LOW (ref 3.5–5.1)
Sodium: 142 mmol/L (ref 135–145)
TOTAL PROTEIN: 7 g/dL (ref 6.5–8.1)

## 2016-10-24 LAB — TROPONIN I: Troponin I: <0.03 ng/mL (ref <0.03)

## 2016-10-24 MED ORDER — POTASSIUM CHLORIDE CRYS ER 20 MEQ PO TBCR
40.0000 meq | EXTENDED_RELEASE_TABLET | Freq: Once | ORAL | Status: AC
  Administered 2016-10-24: 40 meq via ORAL
  Filled 2016-10-24: qty 2

## 2016-10-24 NOTE — ED Provider Notes (Signed)
[facility] Provider Note   CSN: [id number] Arrival date & time: 10/24/16  1646  By signing my name below, I, Collene Leyden, attest that this documentation has been prepared under the direction and in the presence of Shary Decamp, PA-C. Electronically Signed: Collene Leyden, Scribe. 10/24/16. 5:19 PM.   History   Chief Complaint Chief Complaint  Patient presents with  . Chest Pain    HPI Comments: Jeffery Conner is a 61 y.o. male with a history of CAD with two stent placements and one aortic valve, DM, HTN, and HLD who presents to the Emergency Department complaining of sudden-onset, constant left-sided chest pain that began one week ago. Patient states he has had similar chest pain in the past, which he sates is usually an indication of hypokalemia. Pt states that the pain has been a constant 4/10 sensation in left anterior chest wall and described as a dull sensation. No N/V. No diaphoresis. No radiation of the pain. Patient states his pain is not exacerbated by exertion and is unchanged with rest. Nothing improves or worsens his pain. No meds PTA. Patient was last seen by his cardiologist one year ago, in which normal stress tests were performed. Per chart review, Dr. Lynnette Caffey is status post prior PCI of the right coronary artery with placement of a 4.0x18 mm Vision bare metal stent performed in [place] at [facility] in 2007. Most recent coronary angiography in February 2012 done here showed moderate obstructive disease of the right coronary artery. He underwent single vessel bypass surgery as part of his aortic valve surgery in February 2012. No additional symptoms noted. No modifying factors indicated. Patient denies any fever, chills, abdominal pain, shortness of breath. No other symptoms noted.   The history is provided by the patient. No language interpreter was used.    Past Medical History:  Diagnosis Date  . Coronary artery disease   . Diabetes mellitus  without complication ([place])   . Hypercholesterolemia   . Hypertension     There are no active problems to display for this patient.   Past Surgical History:  Procedure Laterality Date  . AORTIC VALVE REPLACEMENT    . CORONARY ARTERY BYPASS GRAFT    . CORONARY STENT PLACEMENT         Home Medications    Prior to Admission medications   Medication Sig Start Date End Date Taking? Authorizing Provider  Azilsartan Medoxomil (EDARBI PO) Take by mouth.   Yes [provider]  metFORMIN (GLUCOPHAGE) 500 MG tablet Take by mouth 2 (two) times daily with a meal.   Yes [provider]  amLODipine (NORVASC) 10 MG tablet Take 10 mg by mouth daily.    [provider]  amLODipine (NORVASC) 10 MG tablet Take 1 tablet (10 mg total) by mouth once. 06/19/13   Rancour, Annie Main, MD  aspirin 325 MG EC tablet Take 81 mg by mouth daily.     [provider]  atorvastatin (LIPITOR) 10 MG tablet Take by mouth daily.    [provider]  bacitracin ointment Apply topically 2 (two) times daily. 02/29/12   Noemi Chapel, MD  Methylcobalamin (B-12) 5000 MCG TBDP Take 1 tablet by mouth 2 (two) times a week. Take on Monday and Thursday    [provider]  potassium chloride (K-DUR) 10 MEQ tablet Take 2 tablets (20 mEq total) by mouth 2 (two) times daily. 01/21/15   Veryl Speak, MD  potassium chloride (K-DUR,KLOR-CON) 10 MEQ tablet Take by mouth  once.    [provider]  potassium chloride SA (K-DUR,KLOR-CON) 20 MEQ tablet Take 20 mEq by mouth 2 (two) times daily.    [provider]  sulfamethoxazole-trimethoprim (BACTRIM DS) 800-160 MG per tablet Take 1 tablet by mouth.    [provider]    Family History History reviewed. No pertinent family history.  Social History Social History  Substance Use Topics  . Smoking status: Never Smoker  . Smokeless tobacco: Never Used  . Alcohol use No     Allergies   Nitrates,  organic   Review of Systems Review of Systems  Constitutional: Negative for chills, diaphoresis and fever.  Respiratory: Negative for shortness of breath.   Cardiovascular: Positive for chest pain.  Gastrointestinal: Negative for abdominal pain, nausea and vomiting.   Physical Exam Updated Vital Signs BP (!) 160/105 (BP Location: Left Arm)   Pulse 82   Temp 98 F (36.7 C) (Oral)   Resp 16   Ht 6' (1.829 m)   Wt 240 lb (108.9 kg)   SpO2 98%   BMI 32.55 kg/m   Physical Exam  Constitutional: He is oriented to person, place, and time. Vital signs are normal. He appears well-developed and well-nourished.  NAD  HENT:  Head: Normocephalic and atraumatic.  Right Ear: Hearing normal.  Left Ear: Hearing normal.  Mouth/Throat: Oropharynx is clear and moist.  Eyes: Conjunctivae and EOM are normal. Pupils are equal, round, and reactive to light.  Neck: Normal range of motion. Neck supple.  Cardiovascular: Normal rate, regular rhythm, normal heart sounds and normal pulses.  Exam reveals no gallop and no friction rub.   No murmur heard. Pulmonary/Chest: Effort normal and breath sounds normal. No respiratory distress. He has no decreased breath sounds. He has no wheezes. He has no rhonchi. He has no rales.  Abdominal: Soft. Bowel sounds are normal.  Musculoskeletal: Normal range of motion.  Neurological: He is alert and oriented to person, place, and time.  Skin: Skin is warm and dry.  Psychiatric: He has a normal mood and affect. His speech is normal and behavior is normal. Thought content normal.  Nursing note and vitals reviewed.  ED Treatments / Results  DIAGNOSTIC STUDIES: Oxygen Saturation is 98% on RA, normal by my interpretation.    COORDINATION OF CARE: 5:19 PM Discussed treatment plan with pt at bedside and pt agreed to plan, which includes a troponin check.   Labs (all labs ordered are listed, but only abnormal results are displayed) Labs Reviewed  CBC WITH  DIFFERENTIAL/PLATELET - Abnormal; Notable for the following:       Result Value   Platelets 139 (*)    All other components within normal limits  COMPREHENSIVE METABOLIC PANEL - Abnormal; Notable for the following:    Potassium 3.3 (*)    Glucose, Bld 104 (*)    All other components within normal limits  TROPONIN I  TROPONIN I    EKG  EKG Interpretation  Date/Time:  Friday Oct 24 2016 16:56:27 EDT Ventricular Rate:  69 PR Interval:    QRS Duration: 115 QT Interval:  432 QTC Calculation: 463 R Axis:   -47 Text Interpretation:  Sinus rhythm Borderline prolonged PR interval Probable left atrial enlargement Left anterior fascicular block Left ventricular hypertrophy Baseline wander in lead(s) V2 Confirmed by Lita Mains  MD, DAVID ([id number]) on 10/24/2016 5:45:07 PM       Radiology Dg Chest 2 View  Result Date: 10/24/2016 CLINICAL DATA:  61 year old presenting with a 1  week history of persistent chest pain. Prior sternotomy for CABG and aortic felt replaced. EXAM: CHEST  2 VIEW COMPARISON:  Chest x-rays 06/30/2011, 12/11/2009 and earlier. CTA chest 12/11/2009, 12/01/2008. FINDINGS: Prior sternotomy. Cardiac silhouette moderately enlarged, unchanged. Thoracic aorta tortuous, atherosclerotic and ectatic, unchanged. Hilar and mediastinal contours otherwise unremarkable. Stable mild chronic elevation of the left hemidiaphragm. Lungs clear. Bronchovascular markings normal. Pulmonary vascularity normal. No visible pleural effusions. No pneumothorax. Visualized bony thorax intact. IMPRESSION: No acute cardiopulmonary disease. Stable cardiomegaly without pulmonary edema. Electronically Signed   By: Evangeline Dakin M.D.   On: 10/24/2016 17:12    Procedures Procedures (including critical care time)  Medications Ordered in ED Medications - No data to display   Initial Impression / Assessment and Plan / ED Course  I have reviewed the triage vital signs and the nursing notes.  Pertinent labs &  imaging results that were available during my care of the patient were reviewed by me and considered in my medical decision making (see chart for details).  Final Clinical Impressions(s) / ED Diagnoses   {I have reviewed and evaluated the relevant laboratory values. {I have reviewed and evaluated the relevant imaging studies. {I have interpreted the relevant EKG. {I have reviewed the relevant previous healthcare records.  {I obtained HPI from historian. {Patient discussed with supervising physician.  ED Course:  Assessment: Pt is a 61 y.o. male presents with CP x 1 week. Hx same with associated hypokalemia. Seen at [facility]. Notes pain is constant sensation in left anterior chest. No N/V. No diaphoresis. No radiation. No worsening or relief with exertion or rest. Risk Factors include DM, HTN, HLD, and CAD with two stents. Patient is to be discharged with recommendation to follow up with PCP in regards to today's hospital visit. Chest pain is not likely of cardiac or pulmonary etiology d/t presentation, VSS, no tracheal deviation, no JVD or new murmur, RRR, breath sounds equal bilaterally, EKG without acute abnormalities, negative troponin x2, and negative CXR. Pt has been advised to follow up with Cardiologist and return to the ED is CP becomes exertional, associated with diaphoresis or nausea, radiates to left jaw/arm, worsens or becomes concerning in any way. Pt appears reliable for follow up and is agreeable to discharge. Patient is in no acute distress. Vital Signs are stable. Patient is able to ambulate. Patient able to tolerate PO.   6:30 PM  Discussed with cardiologist at [facility] (Dr. Marvel Plan) who doubts ACS given presentation. Noted ED visit for Atypical chest pain in past in July. Discussed delta trop, will message Dr Daiva Huge for a sooner follow-up appointment. Okay for discharge if delta trop is negative.   Disposition/Plan:  DC Home Additional Verbal discharge  instructions given and discussed with patient.  Pt Instructed to f/u with Cardiology in the next week for evaluation and treatment of symptoms. Return precautions given Pt acknowledges and agrees with plan  Supervising Physician Julianne Rice, MD  Final diagnoses:  Chest pain, unspecified type    New Prescriptions New Prescriptions   No medications on file   I personally performed the services described in this documentation, which was scribed in my presence. The recorded information has been reviewed and is accurate.    Shary Decamp, PA-C 10/24/16 2025    Julianne Rice, MD 10/31/16 [phone]

## 2016-10-24 NOTE — ED Triage Notes (Signed)
CP constant x 1 week-NAD-steady gait

## 2016-10-24 NOTE — ED Notes (Signed)
Pt verbalizes understanding of d/c instructions and denies any further needs at this time. 

## 2016-10-24 NOTE — ED Notes (Signed)
Patient transported to X-ray 

## 2016-10-24 NOTE — Discharge Instructions (Signed)
Please read and follow all provided instructions.  Your diagnoses today include:  1. Chest pain, unspecified type     Tests performed today include: An EKG of your heart A chest x-ray Cardiac enzymes - a blood test for heart muscle damage Blood counts and electrolytes Vital signs. See below for your results today.   Medications prescribed:   Take any prescribed medications only as directed.  Follow-up instructions: Please follow-up with your Cardiologist as soon as you can for further evaluation of your symptoms. They will call you   Return instructions:  SEEK IMMEDIATE MEDICAL ATTENTION IF: You have severe chest pain, especially if the pain is crushing or pressure-like and spreads to the arms, back, neck, or jaw, or if you have sweating, nausea (feeling sick to your stomach), or shortness of breath. THIS IS AN EMERGENCY. Don't wait to see if the pain will go away. Get medical help at once. Call 911 or 0 (operator). DO NOT drive yourself to the hospital.  Your chest pain gets worse and does not go away with rest.  You have an attack of chest pain lasting longer than usual, despite rest and treatment with the medications your caregiver has prescribed.  You wake from sleep with chest pain or shortness of breath. You feel dizzy or faint. You have chest pain not typical of your usual pain for which you originally saw your caregiver.  You have any other emergent concerns regarding your health.  Additional Information: Chest pain comes from many different causes. Your caregiver has diagnosed you as having chest pain that is not specific for one problem, but does not require admission.  You are at low risk for an acute heart condition or other serious illness.   Your vital signs today were: BP (!) 151/99 (BP Location: Right Arm)    Pulse 61    Temp 98 F (36.7 C) (Oral)    Resp 17    Ht 6' (1.829 m)    Wt 108.9 kg (240 lb)    SpO2 95%    BMI 32.55 kg/m  If your blood pressure (BP) was  elevated above 135/85 this visit, please have this repeated by your doctor within one month. --------------

## 2017-05-12 ENCOUNTER — Other Ambulatory Visit: Payer: Self-pay

## 2017-05-12 ENCOUNTER — Emergency Department (HOSPITAL_BASED_OUTPATIENT_CLINIC_OR_DEPARTMENT_OTHER)
Admission: EM | Admit: 2017-05-12 | Discharge: 2017-05-12 | Disposition: A | Discharge status: Home / Self Care | Payer: BLUE CROSS/BLUE SHIELD | Attending: Emergency Medicine | Admitting: Emergency Medicine

## 2017-05-12 ENCOUNTER — Encounter (HOSPITAL_BASED_OUTPATIENT_CLINIC_OR_DEPARTMENT_OTHER): Payer: Self-pay

## 2017-05-12 DIAGNOSIS — Z79899 Other long term (current) drug therapy: Secondary | ICD-10-CM | POA: Insufficient documentation

## 2017-05-12 DIAGNOSIS — Z955 Presence of coronary angioplasty implant and graft: Secondary | ICD-10-CM | POA: Diagnosis not present

## 2017-05-12 DIAGNOSIS — I10 Essential (primary) hypertension: Secondary | ICD-10-CM | POA: Diagnosis not present

## 2017-05-12 DIAGNOSIS — Z952 Presence of prosthetic heart valve: Secondary | ICD-10-CM | POA: Diagnosis not present

## 2017-05-12 DIAGNOSIS — Z7982 Long term (current) use of aspirin: Secondary | ICD-10-CM | POA: Insufficient documentation

## 2017-05-12 DIAGNOSIS — Z7984 Long term (current) use of oral hypoglycemic drugs: Secondary | ICD-10-CM | POA: Insufficient documentation

## 2017-05-12 DIAGNOSIS — K279 Peptic ulcer, site unspecified, unspecified as acute or chronic, without hemorrhage or perforation: Secondary | ICD-10-CM | POA: Diagnosis not present

## 2017-05-12 DIAGNOSIS — Z951 Presence of aortocoronary bypass graft: Secondary | ICD-10-CM | POA: Diagnosis not present

## 2017-05-12 DIAGNOSIS — E119 Type 2 diabetes mellitus without complications: Secondary | ICD-10-CM | POA: Diagnosis not present

## 2017-05-12 DIAGNOSIS — I2510 Atherosclerotic heart disease of native coronary artery without angina pectoris: Secondary | ICD-10-CM | POA: Diagnosis not present

## 2017-05-12 DIAGNOSIS — R1010 Upper abdominal pain, unspecified: Secondary | ICD-10-CM | POA: Diagnosis present

## 2017-05-12 LAB — COMPREHENSIVE METABOLIC PANEL
ALK PHOS: 86 U/L (ref 38–126)
ALT: 18 U/L (ref 17–63)
ANION GAP: 6 (ref 5–15)
AST: 16 U/L (ref 15–41)
Albumin: 4.6 g/dL (ref 3.5–5.0)
BILIRUBIN TOTAL: 0.6 mg/dL (ref 0.3–1.2)
BUN: 31 mg/dL — ABNORMAL HIGH (ref 6–20)
CALCIUM: 9.8 mg/dL (ref 8.9–10.3)
CO2: 25 mmol/L (ref 22–32)
Chloride: 107 mmol/L (ref 101–111)
Creatinine, Ser: 1.28 mg/dL — ABNORMAL HIGH (ref 0.61–1.24)
GFR calc non Af Amer: 58 mL/min — ABNORMAL LOW (ref >60)
Glucose, Bld: 110 mg/dL — ABNORMAL HIGH (ref 65–99)
POTASSIUM: 5.3 mmol/L — AB (ref 3.5–5.1)
SODIUM: 138 mmol/L (ref 135–145)
TOTAL PROTEIN: 7.6 g/dL (ref 6.5–8.1)

## 2017-05-12 LAB — URINALYSIS, ROUTINE W REFLEX MICROSCOPIC
BILIRUBIN URINE: NEGATIVE
GLUCOSE, UA: NEGATIVE mg/dL
HGB URINE DIPSTICK: NEGATIVE
KETONES UR: NEGATIVE mg/dL
Leukocytes, UA: NEGATIVE
Nitrite: NEGATIVE
PROTEIN: NEGATIVE mg/dL
Specific Gravity, Urine: 1.02 (ref 1.005–1.030)
pH: 7 (ref 5.0–8.0)

## 2017-05-12 LAB — CBC WITH DIFFERENTIAL/PLATELET
BASOS ABS: 0 10*3/uL (ref 0.0–0.1)
BASOS PCT: 0 %
EOS ABS: 0.1 10*3/uL (ref 0.0–0.7)
EOS PCT: 2 %
HEMATOCRIT: 41.7 % (ref 39.0–52.0)
Hemoglobin: 13.4 g/dL (ref 13.0–17.0)
Lymphocytes Relative: 27 %
Lymphs Abs: 1.7 10*3/uL (ref 0.7–4.0)
MCH: 31.8 pg (ref 26.0–34.0)
MCHC: 32.1 g/dL (ref 30.0–36.0)
MCV: 99 fL (ref 78.0–100.0)
MONO ABS: 0.6 10*3/uL (ref 0.1–1.0)
MONOS PCT: 10 %
NEUTROS ABS: 3.7 10*3/uL (ref 1.7–7.7)
Neutrophils Relative %: 61 %
PLATELETS: 165 10*3/uL (ref 150–400)
RBC: 4.21 MIL/uL — ABNORMAL LOW (ref 4.22–5.81)
RDW: 12.6 % (ref 11.5–15.5)
WBC: 6.1 10*3/uL (ref 4.0–10.5)

## 2017-05-12 LAB — LIPASE, BLOOD: LIPASE: 54 U/L — AB (ref 11–51)

## 2017-05-12 MED ORDER — PANTOPRAZOLE SODIUM 20 MG PO TBEC: 20.0000 mg | DELAYED_RELEASE_TABLET | Freq: Every day | ORAL | 0 refills | Status: AC

## 2017-05-12 MED ORDER — GI COCKTAIL ~~LOC~~
30.0000 mL | Freq: Once | ORAL | Status: AC
  Administered 2017-05-12: 30 mL via ORAL
  Filled 2017-05-12: qty 30

## 2017-05-12 NOTE — ED Triage Notes (Addendum)
C/o generalized abd pain x 2-3 weeks-denies n/v/d-states pain eases with OTC meds like mylanta-NAD-steady gait

## 2017-05-12 NOTE — Discharge Instructions (Signed)
Stopped taking Aleve, or any other ibuprofen type products. If you are not having headaches also avoid the Excedrin. While you're giving the Protonix time to work if you have stomach discomfort he can always take it Tums or Mylanta.

## 2017-05-12 NOTE — ED Notes (Signed)
Pt also states he has had increase in "phlegm for years"

## 2017-05-12 NOTE — ED Provider Notes (Signed)
[facility] EM[facility] DEPARTMENT Provider Note   CSN: [id number] Arrival date & time: 05/12/17  1648     History   Chief Complaint Chief Complaint  Patient presents with  . Abdominal Pain    HPI Jeffery Conner is a 61 y.o. male.  Patient is a 61 year old male with a history of diabetes, hypertension, coronary artery disease status post stents, CABG and aortic valve replacement on aspirin daily presenting with upper abdominal pain. He states over the last 2 weeks he's had a building abdominal discomfort that he describes as pressure which then peaks as a throbbing pain and improves when he takes Mylanta or Tums and then the pain gradually builds up and gets bad again. He does not relate that it gets any worse with eating but does seem to be worse at night when he is lying down. He thinks that's just because he's not doing anything else to distract himself. He denies any shortness of breath, weight gain, leg swelling, cough, fever, nausea or vomiting. He does admit to taking Aleve daily as well as Excedrin for some time. He also takes aspirin for his valve replacement. He denies any change in stool. He denies any alcohol use.   The history is provided by the patient.    Past Medical History:  Diagnosis Date  . Coronary artery disease   . Diabetes mellitus without complication ([place])   . Hypercholesterolemia   . Hypertension     There are no active problems to display for this patient.   Past Surgical History:  Procedure Laterality Date  . AORTIC VALVE REPLACEMENT    . CORONARY ARTERY BYPASS GRAFT    . CORONARY STENT PLACEMENT         Home Medications    Prior to Admission medications   Medication Sig Start Date End Date Taking? Authorizing Provider  SPIRONOLACTONE PO Take by mouth.   Yes [provider]  amLODipine (NORVASC) 10 MG tablet Take 10 mg by mouth daily.    [provider]  amLODipine (NORVASC) 10 MG tablet Take 1 tablet (10 mg  total) by mouth once. 06/19/13   Rancour, Annie Main, MD  aspirin 325 MG EC tablet Take 81 mg by mouth daily.     [provider]  atorvastatin (LIPITOR) 10 MG tablet Take by mouth daily.    [provider]  Azilsartan Medoxomil (EDARBI PO) Take by mouth.    [provider]  bacitracin ointment Apply topically 2 (two) times daily. 02/29/12   Noemi Chapel, MD  metFORMIN (GLUCOPHAGE) 500 MG tablet Take by mouth 2 (two) times daily with a meal.    [provider]  Methylcobalamin (B-12) 5000 MCG TBDP Take 1 tablet by mouth 2 (two) times a week. Take on Monday and Thursday    [provider]  potassium chloride (K-DUR) 10 MEQ tablet Take 2 tablets (20 mEq total) by mouth 2 (two) times daily. 01/21/15   Veryl Speak, MD  potassium chloride (K-DUR,KLOR-CON) 10 MEQ tablet Take by mouth once.    [provider]  potassium chloride SA (K-DUR,KLOR-CON) 20 MEQ tablet Take 20 mEq by mouth 2 (two) times daily.    [provider]  sulfamethoxazole-trimethoprim (BACTRIM DS) 800-160 MG per tablet Take 1 tablet by mouth.    [provider]    Family History No family history on file.  Social History Social History   Tobacco Use  . Smoking status: Never Smoker  . Smokeless tobacco: Never Used  Substance Use Topics  . Alcohol use: No  . Drug use: No     Allergies   Nitrates, organic   Review of Systems Review of Systems  All other systems reviewed and are negative.    Physical Exam Updated Vital Signs BP (!) 128/97 (BP Location: Left Arm)   Pulse 67   Temp 98 F (36.7 C) (Oral)   Resp 18   Ht 6' (1.829 m)   Wt 109.7 kg (241 lb 14.4 oz)   SpO2 100%   BMI 32.81 kg/m   Physical Exam  Constitutional: He is oriented to person, place, and time. He appears well-developed and well-nourished. No distress.  HENT:  Head: Normocephalic and atraumatic.  Mouth/Throat: Oropharynx is clear and moist.  Eyes: Conjunctivae and EOM  are normal. Pupils are equal, round, and reactive to light.  Neck: Normal range of motion. Neck supple.  Cardiovascular: Normal rate, regular rhythm and intact distal pulses.  No murmur heard. Pulmonary/Chest: Effort normal and breath sounds normal. No respiratory distress. He has no wheezes. He has no rales.  Abdominal: Soft. He exhibits no distension. There is tenderness in the epigastric area. There is no rebound and no guarding. No hernia.  Minimal epigastric pain  Musculoskeletal: Normal range of motion. He exhibits no edema or tenderness.  Neurological: He is alert and oriented to person, place, and time.  Skin: Skin is warm and dry. No rash noted. No erythema.  Psychiatric: He has a normal mood and affect. His behavior is normal.  Nursing note and vitals reviewed.    ED Treatments / Results  Labs (all labs ordered are listed, but only abnormal results are displayed) Labs Reviewed  COMPREHENSIVE METABOLIC PANEL - Abnormal; Notable for the following components:      Result Value   Potassium 5.3 (*)    Glucose, Bld 110 (*)    BUN 31 (*)    Creatinine, Ser 1.28 (*)    GFR calc non Af Amer 58 (*)    All other components within normal limits  LIPASE, BLOOD - Abnormal; Notable for the following components:   Lipase 54 (*)    All other components within normal limits  CBC WITH DIFFERENTIAL/PLATELET - Abnormal; Notable for the following components:   RBC 4.21 (*)    All other components within normal limits  URINALYSIS, ROUTINE W REFLEX MICROSCOPIC    EKG  EKG Interpretation None       Radiology No results found.  Procedures Procedures (including critical care time)  Medications Ordered in ED Medications  gi cocktail (Maalox,Lidocaine,Donnatal) (30 mLs Oral Given 05/12/17 2020)     Initial Impression / Assessment and Plan / ED Course  I have reviewed the triage vital signs and the nursing notes.  Pertinent labs & imaging results that were available during my  care of the patient were reviewed by me and considered in my medical decision making (see chart for details).     Patient presenting today with symptoms most suggestive of gastritis or peptic ulcer disease. He has a relatively benign abdominal exam with only some minimal epigastric tenderness. There is no evidence of hernia on exam and patient does not have classic exam findings concerning for appendicitis, diverticulitis or cholecystitis. Patient's lipase today is slightly elevated at 54 today but is having no nausea or vomiting. States that symptoms do not seem to worsen with eating and he has had no bowel changes. Patient's CBC and CMP are within normal limits other than a mild hyperkalemia  but patient does take multiple potassium supplements due to prior hypokalemia because of medications. Recommended that patient start a PPI discontinue all anti-inflammatories and follow-up with Dr. Collene Mares his GI doctor. Patient and wife are comfortable with this plan. Patient was also instructed to use Maalox or Tums for discomfort over the next few days while the Protonix was kicking in.  He was given strict return precautions.  Final Clinical Impressions(s) / ED Diagnoses   Final diagnoses:  PUD (peptic ulcer disease)    ED Discharge Orders        Ordered    pantoprazole (PROTONIX) 20 MG tablet  Daily     05/12/17 2115       Blanchie Dessert, MD 05/13/17 [phone]

## 2017-05-12 NOTE — ED Notes (Signed)
Patient asked for reevaluation due to the fact the pain is getting worse. The patient reports that it has increased from 4 - 6 - denies any N/V/D  - patient given urine cup for u/a

## 2019-05-09 DIAGNOSIS — N5201 Erectile dysfunction due to arterial insufficiency: Secondary | ICD-10-CM | POA: Insufficient documentation

## 2019-06-04 ENCOUNTER — Emergency Department (HOSPITAL_BASED_OUTPATIENT_CLINIC_OR_DEPARTMENT_OTHER)
Admission: EM | Admit: 2019-06-04 | Discharge: 2019-06-04 | Disposition: A | Discharge status: Home / Self Care | Payer: BC Managed Care – PPO | Attending: Emergency Medicine | Admitting: Emergency Medicine

## 2019-06-04 ENCOUNTER — Encounter (HOSPITAL_BASED_OUTPATIENT_CLINIC_OR_DEPARTMENT_OTHER): Payer: Self-pay | Admitting: Emergency Medicine

## 2019-06-04 ENCOUNTER — Other Ambulatory Visit: Payer: Self-pay

## 2019-06-04 ENCOUNTER — Emergency Department (HOSPITAL_BASED_OUTPATIENT_CLINIC_OR_DEPARTMENT_OTHER): Payer: BC Managed Care – PPO

## 2019-06-04 DIAGNOSIS — Z951 Presence of aortocoronary bypass graft: Secondary | ICD-10-CM | POA: Insufficient documentation

## 2019-06-04 DIAGNOSIS — Z952 Presence of prosthetic heart valve: Secondary | ICD-10-CM | POA: Insufficient documentation

## 2019-06-04 DIAGNOSIS — I10 Essential (primary) hypertension: Secondary | ICD-10-CM | POA: Insufficient documentation

## 2019-06-04 DIAGNOSIS — R0789 Other chest pain: Secondary | ICD-10-CM | POA: Insufficient documentation

## 2019-06-04 DIAGNOSIS — Z7982 Long term (current) use of aspirin: Secondary | ICD-10-CM | POA: Diagnosis not present

## 2019-06-04 DIAGNOSIS — I2510 Atherosclerotic heart disease of native coronary artery without angina pectoris: Secondary | ICD-10-CM | POA: Diagnosis not present

## 2019-06-04 DIAGNOSIS — E119 Type 2 diabetes mellitus without complications: Secondary | ICD-10-CM | POA: Diagnosis not present

## 2019-06-04 LAB — CBC
HCT: 42.6 % (ref 39.0–52.0)
Hemoglobin: 13.9 g/dL (ref 13.0–17.0)
MCH: 31.9 pg (ref 26.0–34.0)
MCHC: 32.6 g/dL (ref 30.0–36.0)
MCV: 97.7 fL (ref 80.0–100.0)
Platelets: 150 10*3/uL (ref 150–400)
RBC: 4.36 MIL/uL (ref 4.22–5.81)
RDW: 12.5 % (ref 11.5–15.5)
WBC: 3.4 10*3/uL — ABNORMAL LOW (ref 4.0–10.5)
nRBC: 0 % (ref 0.0–0.2)

## 2019-06-04 LAB — BASIC METABOLIC PANEL
Anion gap: 10 (ref 5–15)
BUN: 27 mg/dL — ABNORMAL HIGH (ref 8–23)
CO2: 24 mmol/L (ref 22–32)
Calcium: 9.3 mg/dL (ref 8.9–10.3)
Chloride: 103 mmol/L (ref 98–111)
Creatinine, Ser: 1.27 mg/dL — ABNORMAL HIGH (ref 0.61–1.24)
GFR calc Af Amer: >60 mL/min (ref >60)
GFR calc non Af Amer: 59 mL/min — ABNORMAL LOW (ref >60)
Glucose, Bld: 126 mg/dL — ABNORMAL HIGH (ref 70–99)
Potassium: 4.1 mmol/L (ref 3.5–5.1)
Sodium: 137 mmol/L (ref 135–145)

## 2019-06-04 LAB — TROPONIN I (HIGH SENSITIVITY)
Troponin I (High Sensitivity): 4 ng/L (ref <18)
Troponin I (High Sensitivity): 3 ng/L (ref <18)

## 2019-06-04 MED ORDER — ASPIRIN 81 MG PO CHEW
324.0000 mg | CHEWABLE_TABLET | Freq: Once | ORAL | Status: DC
  Filled 2019-06-04: qty 4

## 2019-06-04 NOTE — Discharge Instructions (Addendum)
You were seen in the emergency department today for chest pain. Your work-up in the emergency department has been overall reassuring. Your labs have been fairly normal and or similar to previous blood work you have had done. Your potassium was normal. Your EKG and the enzyme we use to check your heart did not show an acute heart attack at this time. Your chest x-ray was normal with the exception of mild heart enlargement which has been seen on previous imaging.   We would like you to follow up closely with your primary care provider and/or the cardiologist provided in your discharge instructions within 1-3 days. Return to the ER immediately should you experience any new or worsening symptoms including but not limited to return of pain, worsened pain, vomiting, shortness of breath, dizziness, lightheadedness, passing out, or any other concerns that you may have.

## 2019-06-04 NOTE — ED Notes (Signed)
XR at bedside

## 2019-06-04 NOTE — ED Provider Notes (Signed)
[facility] EMERGENCY DEPARTMENT Provider Note   CSN: [id number] Arrival date & time: 06/04/19  0920     History Chief Complaint  Patient presents with  . Chest Pain    Jeffery Conner is a 64 y.o. male with a history of hypertension, hypercholesterolemia, diabetes mellitus, CAD s/p CABG & stent placement, & aortic valve replacement who presents to the ED with complaints of intermittent chest discomfort for 1.5 weeks. Patient states the discomfort is a pressure not a pain to the anterior chest which is non radiating, occurs 2-3 times per day lasting 10-15 minutes per episode, no alleviating/aggravating factors, no change with exertion, deep breath, eating or position. No discomfort @ present. States this feels like when his potassium has been low in the past, states this does not feel like his prior CAD issues requiring stent/bypass. Denies nausea, vomiting, diaphoresis, dyspnea, syncope,  leg pain/swelling, hemoptysis, recent surgery/trauma, recent long travel, hormone use, personal hx of cancer, or hx of DVT/PE.    HPI     Past Medical History:  Diagnosis Date  . Coronary artery disease   . Diabetes mellitus without complication ([place])   . Hypercholesterolemia   . Hypertension     There are no problems to display for this patient.   Past Surgical History:  Procedure Laterality Date  . AORTIC VALVE REPLACEMENT    . CORONARY ARTERY BYPASS GRAFT    . CORONARY STENT PLACEMENT         No family history on file.  Social History   Tobacco Use  . Smoking status: Never Smoker  . Smokeless tobacco: Never Used  Substance Use Topics  . Alcohol use: No  . Drug use: No    Home Medications Prior to Admission medications   Medication Sig Start Date End Date Taking? Authorizing Provider  amLODipine (NORVASC) 10 MG tablet Take 10 mg by mouth daily.    [provider]  amLODipine (NORVASC) 10 MG tablet Take 1 tablet (10 mg total) by mouth once. 06/19/13    Rancour, Annie Main, MD  aspirin 325 MG EC tablet Take 81 mg by mouth daily.     [provider]  atorvastatin (LIPITOR) 10 MG tablet Take by mouth daily.    [provider]  Azilsartan Medoxomil (EDARBI PO) Take by mouth.    [provider]  bacitracin ointment Apply topically 2 (two) times daily. 02/29/12   Noemi Chapel, MD  metFORMIN (GLUCOPHAGE) 500 MG tablet Take by mouth 2 (two) times daily with a meal.    [provider]  Methylcobalamin (B-12) 5000 MCG TBDP Take 1 tablet by mouth 2 (two) times a week. Take on Monday and Thursday    [provider]  pantoprazole (PROTONIX) 20 MG tablet Take 1 tablet (20 mg total) by mouth daily. 05/12/17   Blanchie Dessert, MD  potassium chloride (K-DUR) 10 MEQ tablet Take 2 tablets (20 mEq total) by mouth 2 (two) times daily. 01/21/15   Veryl Speak, MD  potassium chloride (K-DUR,KLOR-CON) 10 MEQ tablet Take by mouth once.    [provider]  potassium chloride SA (K-DUR,KLOR-CON) 20 MEQ tablet Take 20 mEq by mouth 2 (two) times daily.    [provider]  SPIRONOLACTONE PO Take by mouth.    [provider]  sulfamethoxazole-trimethoprim (BACTRIM DS) 800-160 MG per tablet Take 1 tablet by mouth.    [provider]    Allergies    Nitrates, organic  Review of Systems   Review of  Systems  Constitutional: Negative for chills, diaphoresis and fever.  Respiratory: Negative for cough and shortness of breath.   Cardiovascular: Positive for chest pain ("discomfort not pain"). Negative for palpitations and leg swelling.  Gastrointestinal: Negative for abdominal pain, nausea and vomiting.  Neurological: Negative for dizziness and syncope.  All other systems reviewed and are negative.   Physical Exam Updated Vital Signs BP (!) 135/91 (BP Location: Right Arm)   Pulse 67   Temp 97.7 F (36.5 C) (Oral)   Resp 18   Ht 6' (1.829 m)   Wt 111.6 kg   SpO2 96%   BMI 33.36 kg/m    Physical Exam Vitals and nursing note reviewed.  Constitutional:      General: He is not in acute distress.    Appearance: He is well-developed. He is not toxic-appearing.  HENT:     Head: Normocephalic and atraumatic.  Eyes:     General:        Right eye: No discharge.        Left eye: No discharge.     Conjunctiva/sclera: Conjunctivae normal.  Cardiovascular:     Rate and Rhythm: Normal rate and regular rhythm.     Pulses:          Radial pulses are 2+ on the right side and 2+ on the left side.  Pulmonary:     Effort: Pulmonary effort is normal. No respiratory distress.     Breath sounds: Normal breath sounds. No wheezing, rhonchi or rales.  Chest:     Chest wall: No tenderness.     Comments: Well healed midline sternotomy scar.  Abdominal:     General: There is no distension.     Palpations: Abdomen is soft.     Tenderness: There is no abdominal tenderness.  Musculoskeletal:     Cervical back: Neck supple.     Right lower leg: No tenderness. No edema.     Left lower leg: No tenderness. No edema.  Skin:    General: Skin is warm and dry.     Findings: No rash.  Neurological:     Mental Status: He is alert.     Comments: Clear speech.   Psychiatric:        Behavior: Behavior normal.     ED Results / Procedures / Treatments   Labs (all labs ordered are listed, but only abnormal results are displayed) Labs Reviewed  BASIC METABOLIC PANEL  CBC  TROPONIN I (HIGH SENSITIVITY)    EKG EKG Interpretation  Date/Time:  Saturday June 04 2019 09:34:23 EST Ventricular Rate:  68 PR Interval:    QRS Duration: 107 QT Interval:  406 QTC Calculation: 432 R Axis:   -37 Text Interpretation: Sinus rhythm Borderline prolonged PR interval Abnormal R-wave progression, late transition Left ventricular hypertrophy Confirmed by Virgel Manifold [phone]) on 06/04/2019 9:39:46 AM   Radiology DG Chest Portable 1 View  Result Date: 06/04/2019 CLINICAL DATA:  C/o Lt sided chest  pressure x 1.5 wks. Hx CAD, valve repair EXAM: PORTABLE CHEST 1 VIEW COMPARISON:  Chest radiograph 10/24/2016 FINDINGS: Stable cardiomediastinal contours status post median sternotomy. Enlarged heart size. No evidence of edema. No focal infiltrate. Persistent elevation of the left hemidiaphragm. No pneumothorax or large pleural effusion. IMPRESSION: Cardiomegaly without evidence of edema or infection. Electronically Signed   By: Audie Pinto M.D.   On: 06/04/2019 10:00    Procedures Procedures (including critical care time)  Medications Ordered in ED Medications  aspirin chewable tablet 324  mg (324 mg Oral Not Given 06/04/19 1027)    ED Course  I have reviewed the triage vital signs and the nursing notes.  Pertinent labs & imaging results that were available during my care of the patient were reviewed by me and considered in my medical decision making (see chart for details).    MDM Rules/Calculators/A&P                      Patient presents to the ED with complaints of intermittent chest discomfort x 1.5 weeks. Nontoxic appearing, resting comfortably, vitals WNL. Chest pain free currently. Benign physical exam. Plan for EKG, labs, CXR. Will give aspirin.   Prior records reviewed including most recent outpatient cardiology note 10/2016: Patient status post prior PCI of the RCA in 2007. Most recent  coronary angiography in February 2012 showed moderate obstructive  disease of the RCA. He underwent single vessel bypass  surgery as part of his aortic valve surgery in February 2012. Has had some atypical chest pain. He did have a borderline mildly positive stress echo in July 2017. He had preserved LV function, but mild septal wall motion abnormalities of stress. He exercised 10.2 METS. It was felt some of his symptoms may be related to his blood pressure. Plan was to target this and if symptoms do not improve, consider further evaluation.  EKG: Sinus rhythm Borderline prolonged PR  interval Abnormal R-wave progression, late transition Left ventricular hypertrophy CXR: Cardiomegaly without evidence of edema or infection. CBC: Mild leukopenia- PCP recheck.No anemia.  BMP: Renal function & hyperglycemia similar to prior. No significant electrolyte derangement.  Troponin: 4, downtrending to 3  low risk [place], doubt PE.  No widened mediastinum on chest x-ray, symmetric pulses, no neuro symptoms, doubt dissection.  Chest x-ray also without pneumothorax or pneumonia. Labs w/o anemia or electrolyte derangement. Patient does have history of CAD, however history seems atypical- he states this feels nothing like prior pain w/ interventions, will have him follow up very closely with his cardiologist. I discussed results, treatment plan, need for follow-up, and return precautions with the patient. Provided opportunity for questions, patient confirmed understanding and is in agreement with plan.   Findings and plan of care discussed with supervising physician Dr. Wilson Singer who has evaluated patient & is in agreement    Final Clinical Impression(s) / ED Diagnoses Final diagnoses:  Chest discomfort    Rx / DC Orders ED Discharge Orders    None       Amaryllis Dyke, PA-C 06/04/19 1316    Virgel Manifold, MD 06/05/19 [phone]

## 2019-06-04 NOTE — ED Triage Notes (Signed)
Generalized chest pressure x 1 week.

## 2019-07-14 ENCOUNTER — Ambulatory Visit: Payer: BC Managed Care – PPO | Attending: Internal Medicine

## 2019-07-14 DIAGNOSIS — Z23 Encounter for immunization: Secondary | ICD-10-CM

## 2019-07-14 NOTE — Progress Notes (Signed)
   Covid-19 Vaccination Clinic  Name:  Jeffery Conner    MRN: [id number] DOB: 1955/06/19  07/14/2019  [name] was observed post Covid-19 immunization for 15 minutes without incidence. He was provided with Vaccine Information Sheet and instruction to access the V-Safe system.   [name] was instructed to call 911 with any severe reactions post vaccine: Marland Kitchen Difficulty breathing  . Swelling of your face and throat  . A fast heartbeat  . A bad rash all over your body  . Dizziness and weakness    Immunizations Administered    Name Date Dose VIS Date Route   Pfizer COVID-19 Vaccine 07/14/2019  4:44 PM 0.3 mL 05/13/2019 Intramuscular   Manufacturer: [place]   Lot: [id number]   [place]: [id number]

## 2019-08-07 ENCOUNTER — Ambulatory Visit: Payer: BC Managed Care – PPO | Attending: Internal Medicine

## 2019-08-07 DIAGNOSIS — Z23 Encounter for immunization: Secondary | ICD-10-CM | POA: Insufficient documentation

## 2019-08-07 NOTE — Progress Notes (Signed)
   Covid-19 Vaccination Clinic  Name:  Jeffery Conner    MRN: [id number] DOB: 02/05/1956  08/07/2019  Mr. Jeffery Conner was observed post Covid-19 immunization for 15 minutes without incident. He was provided with Vaccine Information Sheet and instruction to access the V-Safe system.   Mr. Jeffery Conner was instructed to call 911 with any severe reactions post vaccine: Marland Kitchen Difficulty breathing  . Swelling of face and throat  . A fast heartbeat  . A bad rash all over body  . Dizziness and weakness   Immunizations Administered    Name Date Dose VIS Date Route   Pfizer COVID-19 Vaccine 08/07/2019  8:03 AM 0.3 mL 05/13/2019 Intramuscular   Manufacturer: [place]   Lot: [id number]   [place]: [id number]

## 2020-04-20 ENCOUNTER — Encounter: Payer: Self-pay | Admitting: Podiatry

## 2020-04-20 ENCOUNTER — Other Ambulatory Visit: Payer: Self-pay

## 2020-04-20 ENCOUNTER — Ambulatory Visit: Payer: BC Managed Care – PPO | Admitting: Podiatry

## 2020-04-20 DIAGNOSIS — M47816 Spondylosis without myelopathy or radiculopathy, lumbar region: Secondary | ICD-10-CM | POA: Insufficient documentation

## 2020-04-20 DIAGNOSIS — E785 Hyperlipidemia, unspecified: Secondary | ICD-10-CM | POA: Insufficient documentation

## 2020-04-20 DIAGNOSIS — L600 Ingrowing nail: Secondary | ICD-10-CM

## 2020-04-20 DIAGNOSIS — E669 Obesity, unspecified: Secondary | ICD-10-CM | POA: Insufficient documentation

## 2020-04-20 DIAGNOSIS — R7309 Other abnormal glucose: Secondary | ICD-10-CM | POA: Insufficient documentation

## 2020-04-20 DIAGNOSIS — I119 Hypertensive heart disease without heart failure: Secondary | ICD-10-CM | POA: Insufficient documentation

## 2020-04-20 DIAGNOSIS — E559 Vitamin D deficiency, unspecified: Secondary | ICD-10-CM | POA: Insufficient documentation

## 2020-04-20 DIAGNOSIS — N138 Other obstructive and reflux uropathy: Secondary | ICD-10-CM | POA: Insufficient documentation

## 2020-04-20 MED ORDER — NEOMYCIN-POLYMYXIN-HC 3.5-10000-1 OT SOLN: OTIC | 1 refills | Status: AC

## 2020-04-20 NOTE — Patient Instructions (Signed)
Place 1/4 cup of epsom salts in a quart of warm tap water.  Submerge your foot or feet in the solution and soak for 20 minutes.  This soak should be done twice a day.  Next, remove your foot or feet from solution, blot dry the affected area. Apply ointment and cover if instructed by your doctor.   IF YOUR SKIN BECOMES IRRITATED WHILE USING THESE INSTRUCTIONS, IT IS OKAY TO SWITCH TO  WHITE VINEGAR AND WATER.  As another alternative soak, you may use antibacterial soap and water.  Monitor for any signs/symptoms of infection. Call the office immediately if any occur or go directly to the emergency room. Call with any questions/concerns.  Ingrown Toenail An ingrown toenail occurs when the corner or sides of a toenail grow into the surrounding skin. This causes discomfort and pain. The big toe is most commonly affected, but any of the toes can be affected. If an ingrown toenail is not treated, it can become infected. What are the causes? This condition may be caused by:  Wearing shoes that are too small or tight.  An injury, such as stubbing your toe or having your toe stepped on.  Improper cutting or care of your toenails.  Having nail or foot abnormalities that were present from birth (congenital abnormalities), such as having a nail that is too big for your toe. What increases the risk? The following factors may make you more likely to develop ingrown toenails:  Age. Nails tend to get thicker with age, so ingrown nails are more common among older people.  Cutting your toenails incorrectly, such as cutting them very short or cutting them unevenly. An ingrown toenail is more likely to get infected if you have:  Diabetes.  Blood flow (circulation) problems. What are the signs or symptoms? Symptoms of an ingrown toenail may include:  Pain, soreness, or tenderness.  Redness.  Swelling.  Hardening of the skin that surrounds the toenail. Signs that an ingrown toenail may be infected  include:  Fluid or pus.  Symptoms that get worse instead of better. How is this diagnosed? An ingrown toenail may be diagnosed based on your medical history, your symptoms, and a physical exam. If you have fluid or blood coming from your toenail, a sample may be collected to test for the specific type of bacteria that is causing the infection. How is this treated? Treatment depends on how severe your ingrown toenail is. You may be able to care for your toenail at home.  If you have an infection, you may be prescribed antibiotic medicines.  If you have fluid or pus draining from your toenail, your health care provider may drain it.  If you have trouble walking, you may be given crutches to use.  If you have a severe or infected ingrown toenail, you may need a procedure to remove part or all of the nail. Follow these instructions at home: Foot care   Do not pick at your toenail or try to remove it yourself.  Soak your foot in warm, soapy water. Do this for 20 minutes, 3 times a day, or as often as told by your health care provider. This helps to keep your toe clean and keep your skin soft.  Wear shoes that fit well and are not too tight. Your health care provider may recommend that you wear open-toed shoes while you heal.  Trim your toenails regularly and carefully. Cut your toenails straight across to prevent injury to the skin at the   corners of the toenail. Do not cut your nails in a curved shape.  Keep your feet clean and dry to help prevent infection. Medicines  Take over-the-counter and prescription medicines only as told by your health care provider.  If you were prescribed an antibiotic, take it as told by your health care provider. Do not stop taking the antibiotic even if you start to feel better. Activity  Return to your normal activities as told by your health care provider. Ask your health care provider what activities are safe for you.  Avoid activities that cause  pain. General instructions  If your health care provider told you to use crutches to help you move around, use them as instructed.  Keep all follow-up visits as told by your health care provider. This is important. Contact a health care provider if:  You have more redness, swelling, pain, or other symptoms that do not improve with treatment.  You have fluid, blood, or pus coming from your toenail. Get help right away if:  You have a red streak on your skin that starts at your foot and spreads up your leg.  You have a fever. Summary  An ingrown toenail occurs when the corner or sides of a toenail grow into the surrounding skin. This causes discomfort and pain. The big toe is most commonly affected, but any of the toes can be affected.  If an ingrown toenail is not treated, it can become infected.  Fluid or pus draining from your toenail is a sign of infection. Your health care provider may need to drain it. You may be given antibiotics to treat the infection.  Trimming your toenails regularly and properly can help you prevent an ingrown toenail. This information is not intended to replace advice given to you by your health care provider. Make sure you discuss any questions you have with your health care provider. Document Revised: 09/10/2018 Document Reviewed: 02/04/2017 Elsevier Patient Education  2020 Elsevier Inc.  

## 2020-04-22 NOTE — Progress Notes (Signed)
Subjective:   Patient ID: Jeffery Conner, male   DOB: 64 y.o.   MRN: [id number]   HPI Patient presents stating he is having pain in both his big toenails and it is increasingly difficult for him to be able to trim them and they are thick and he would like to have them removed.  Patient does not smoke likes to be active if possible   Review of Systems  All other systems reviewed and are negative.       Objective:  Physical Exam Vitals and nursing note reviewed.  Constitutional:      Appearance: He is well-developed.  Pulmonary:     Effort: Pulmonary effort is normal.  Musculoskeletal:        General: Normal range of motion.  Skin:    General: Skin is warm.  Neurological:     Mental Status: He is alert.     Neurovascular status was found to be intact muscle strength was adequate.  Range of motion subtalar midtarsal joint adequate and patient is noted to have thick dystrophic deformed painful hallux nails of both feet.  Patient states that this has been this way for a long time and increasingly difficult for him to take care of himself.  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  I explained procedures to patient with risk and patient wants surgery.  I went ahead today and I allowed him to sign consent form I then infiltrated each hallux 60 mg like Marcaine mixture and sterile prep applied.  Using sterile instrumentation remove the hallux nails exposed matrix applied phenol 5 applications 30 second followed by alcohol lavage and sterile dressing.  Gave instructions on soaks and advised patient to leave dressing on 24 hours but take it off earlier if any throbbing were to occur.  Patient is to be seen back and is encouraged to call with questions concerns

## 2020-05-02 ENCOUNTER — Other Ambulatory Visit: Payer: Self-pay | Admitting: Otolaryngology

## 2020-05-02 DIAGNOSIS — R0982 Postnasal drip: Secondary | ICD-10-CM

## 2020-05-02 DIAGNOSIS — M542 Cervicalgia: Secondary | ICD-10-CM

## 2020-05-22 ENCOUNTER — Ambulatory Visit (INDEPENDENT_AMBULATORY_CARE_PROVIDER_SITE_OTHER): Payer: BC Managed Care – PPO | Admitting: Otolaryngology

## 2020-05-28 ENCOUNTER — Ambulatory Visit
Admission: RE | Admit: 2020-05-28 | Discharge: 2020-05-28 | Disposition: A | Discharge status: Home / Self Care | Payer: BC Managed Care – PPO | Source: Ambulatory Visit | Attending: Otolaryngology | Admitting: Otolaryngology

## 2020-05-28 DIAGNOSIS — R0982 Postnasal drip: Secondary | ICD-10-CM

## 2020-05-28 DIAGNOSIS — M542 Cervicalgia: Secondary | ICD-10-CM

## 2020-05-28 MED ORDER — IOPAMIDOL (ISOVUE-300) INJECTION 61%
75.0000 mL | Freq: Once | INTRAVENOUS | Status: AC | PRN
  Administered 2020-05-28: 75 mL via INTRAVENOUS

## 2020-06-06 ENCOUNTER — Encounter (INDEPENDENT_AMBULATORY_CARE_PROVIDER_SITE_OTHER): Payer: Self-pay | Admitting: Otolaryngology

## 2020-06-06 ENCOUNTER — Ambulatory Visit (INDEPENDENT_AMBULATORY_CARE_PROVIDER_SITE_OTHER): Payer: BC Managed Care – PPO | Admitting: Otolaryngology

## 2020-06-06 ENCOUNTER — Other Ambulatory Visit: Payer: Self-pay

## 2020-06-06 VITALS — Temp 96.8°F

## 2020-06-06 DIAGNOSIS — D1779 Benign lipomatous neoplasm of other sites: Secondary | ICD-10-CM | POA: Diagnosis not present

## 2020-06-06 DIAGNOSIS — J310 Chronic rhinitis: Secondary | ICD-10-CM | POA: Diagnosis not present

## 2020-06-06 NOTE — Progress Notes (Signed)
HPI: Jeffery Conner is a 65 y.o. male who presents is referred by his PCP for evaluation of posterior left neck mass that he has had for over a year.  He is also had history of "sinus drainage".  He had a CT scan of the sinuses that showed mostly clear paranasal sinuses with a minimal mucosal thickening.  No opacification and no air-fluid levels.  Minimal septal deviation.  Mild rhinitis.  Past Medical History:  Diagnosis Date  . Coronary artery disease   . Diabetes mellitus without complication ([facility])   . Hypercholesterolemia   . Hypertension    Past Surgical History:  Procedure Laterality Date  . AORTIC VALVE REPLACEMENT    . CORONARY ARTERY BYPASS GRAFT    . CORONARY STENT PLACEMENT     Social History   Socioeconomic History  . Marital status: Married    Spouse name: Not on file  . Number of children: Not on file  . Years of education: Not on file  . Highest education level: Not on file  Occupational History  . Not on file  Tobacco Use  . Smoking status: Never Smoker  . Smokeless tobacco: Never Used  Substance and Sexual Activity  . Alcohol use: No  . Drug use: No  . Sexual activity: Not on file  Other Topics Concern  . Not on file  Social History Narrative  . Not on file   Social Determinants of Health   Financial Resource Strain: Not on file  Food Insecurity: Not on file  Transportation Needs: Not on file  Physical Activity: Not on file  Stress: Not on file  Social Connections: Not on file   No family history on file. Allergies  Allergen Reactions  . Penicillins Hives and Rash  . Nitrates, Organic    Prior to Admission medications   Medication Sig Start Date End Date Taking? Authorizing Provider  albuterol (PROAIR HFA) 108 (90 Base) MCG/ACT inhaler ProAir HFA 90 mcg/actuation aerosol inhaler    [provider]  amLODipine (NORVASC) 10 MG tablet Take 10 mg by mouth daily.    [provider]  amLODipine (NORVASC) 10 MG tablet Take 1 tablet  (10 mg total) by mouth once. 06/19/13   Rancour, Jeannett Senior, MD  aspirin 325 MG EC tablet Take 81 mg by mouth daily.     [provider]  atorvastatin (LIPITOR) 10 MG tablet Take by mouth daily.    [provider]  Azilsartan Medoxomil (EDARBI PO) Take by mouth.    [provider]  bacitracin ointment Apply topically 2 (two) times daily. 02/29/12   Eber Hong, MD  Cholecalciferol 125 MCG (5000 UT) TABS daily.    [provider]  Coenzyme Q10 300 MG CAPS daily.    [provider]  cyclobenzaprine (FLEXERIL) 10 MG tablet cyclobenzaprine 10 mg tablet    [provider]  diphenhydrAMINE (BENADRYL) 25 mg capsule Take by mouth.    [provider]  ezetimibe (ZETIA) 10 MG tablet Take 10 mg by mouth daily. 01/27/20   [provider]  labetalol (NORMODYNE) 200 MG tablet  11/28/13   [provider]  levocetirizine (XYZAL) 5 MG tablet levocetirizine 5 mg tablet  TAKE 1 TABLET BY MOUTH EVERY DAY AS NEEDED    [provider]  meloxicam (MOBIC) 7.5 MG tablet meloxicam 7.5 mg tablet  Take 1 tablet every day by oral route.    [provider]  metFORMIN (GLUCOPHAGE) 500 MG tablet Take by mouth 2 (two)  times daily with a meal.    [provider]  Methylcobalamin (B-12) 5000 MCG TBDP Take 1 tablet by mouth 2 (two) times a week. Take on Monday and Thursday    [provider]  neomycin-polymyxin-hydrocortisone (CORTISPORIN) OTIC solution Apply 1-2 drops to toe after soaking BID 04/20/20   Wallene Huh, DPM  pantoprazole (PROTONIX) 20 MG tablet Take 1 tablet (20 mg total) by mouth daily. 05/12/17   Blanchie Dessert, MD  potassium chloride (K-DUR) 10 MEQ tablet Take 2 tablets (20 mEq total) by mouth 2 (two) times daily. 01/21/15   Veryl Speak, MD  potassium chloride (K-DUR,KLOR-CON) 10 MEQ tablet Take by mouth once.    [provider]  potassium chloride SA (K-DUR,KLOR-CON) 20 MEQ tablet Take  20 mEq by mouth 2 (two) times daily.    [provider]  pravastatin (PRAVACHOL) 40 MG tablet  11/28/13   [provider]  SPIRONOLACTONE PO Take by mouth.    [provider]  sulfamethoxazole-trimethoprim (BACTRIM DS) 800-160 MG per tablet Take 1 tablet by mouth.    [provider]  tadalafil (CIALIS) 5 MG tablet Take by mouth. 05/09/19   [provider]  terbinafine (LAMISIL) 250 MG tablet terbinafine HCl 250 mg tablet    [provider]  triamterene-hydrochlorothiazide (DYAZIDE) 37.5-25 MG capsule Take 1 capsule by mouth daily. 02/01/20   [provider]     Positive ROS: Otherwise negative  All other systems have been reviewed and were otherwise negative with the exception of those mentioned in the HPI and as above.  Physical Exam: Constitutional: Alert, well-appearing, no acute distress Ears: External ears without lesions or tenderness. Ear canals are clear bilaterally with intact, clear TMs.  Nasal: External nose without lesions. Septum with mild deformity and moderate rhinitis.  Both middle meatus regions were clear no polyps noted.  No signs of infection.. Oral: Lips and gums without lesions. Tongue and palate mucosa without lesions. Posterior oropharynx clear. Neck: No palpable adenopathy or masses.  Patient has a moderate size lipoma of the posterior left neck measuring approximately 4 x 5 cm in size and is soft to palpation.  This is partially adherent to the dermis. Respiratory: Breathing comfortably  Skin: No facial/neck lesions or rash noted.  Procedures  Assessment: Chronic rhinitis. Posterior left neck lipoma.  Plan: For the postnasal drainage suggested use of Nasacort 2 sprays each nostril at night as this will help some with congestion and postnasal drainage.  Also recommended use of saline nasal irrigations as this will help some with postnasal drainage. Reviewed with him concerning excising the lipoma under  local anesthetic and we will plan on scheduling this at his convenience.   Radene Journey, MD   CC:

## 2020-06-15 ENCOUNTER — Ambulatory Visit (INDEPENDENT_AMBULATORY_CARE_PROVIDER_SITE_OTHER): Payer: BC Managed Care – PPO | Admitting: Otolaryngology

## 2020-07-05 DIAGNOSIS — D1779 Benign lipomatous neoplasm of other sites: Secondary | ICD-10-CM

## 2020-07-10 ENCOUNTER — Ambulatory Visit (INDEPENDENT_AMBULATORY_CARE_PROVIDER_SITE_OTHER): Payer: BC Managed Care – PPO | Admitting: Otolaryngology

## 2020-07-10 ENCOUNTER — Other Ambulatory Visit: Payer: Self-pay

## 2020-07-10 VITALS — Temp 95.2°F

## 2020-07-10 DIAGNOSIS — Z4889 Encounter for other specified surgical aftercare: Secondary | ICD-10-CM

## 2020-07-10 NOTE — Progress Notes (Signed)
HPI: Jeffery Conner is a 65 y.o. male who presents 5 days s/p excision of large left posterior neck lipoma as well as removal of a small right neck lipoma.  He is doing well..   Past Medical History:  Diagnosis Date  . Coronary artery disease   . Diabetes mellitus without complication ([place])   . Hypercholesterolemia   . Hypertension    Past Surgical History:  Procedure Laterality Date  . AORTIC VALVE REPLACEMENT    . CORONARY ARTERY BYPASS GRAFT    . CORONARY STENT PLACEMENT     Social History   Socioeconomic History  . Marital status: Married    Spouse name: Not on file  . Number of children: Not on file  . Years of education: Not on file  . Highest education level: Not on file  Occupational History  . Not on file  Tobacco Use  . Smoking status: Never Smoker  . Smokeless tobacco: Never Used  Substance and Sexual Activity  . Alcohol use: No  . Drug use: No  . Sexual activity: Not on file  Other Topics Concern  . Not on file  Social History Narrative  . Not on file   Social Determinants of Health   Financial Resource Strain: Not on file  Food Insecurity: Not on file  Transportation Needs: Not on file  Physical Activity: Not on file  Stress: Not on file  Social Connections: Not on file   No family history on file. Allergies  Allergen Reactions  . Penicillins Hives and Rash  . Nitrates, Organic    Prior to Admission medications   Medication Sig Start Date End Date Taking? Authorizing Provider  albuterol (PROAIR HFA) 108 (90 Base) MCG/ACT inhaler ProAir HFA 90 mcg/actuation aerosol inhaler    [provider]  amLODipine (NORVASC) 10 MG tablet Take 10 mg by mouth daily.    [provider]  amLODipine (NORVASC) 10 MG tablet Take 1 tablet (10 mg total) by mouth once. 06/19/13   Rancour, Annie Main, MD  aspirin 325 MG EC tablet Take 81 mg by mouth daily.     [provider]  atorvastatin (LIPITOR) 10 MG tablet Take by mouth daily.    [provider]  Azilsartan Medoxomil (EDARBI PO) Take by mouth.    [provider]  bacitracin ointment Apply topically 2 (two) times daily. 02/29/12   Noemi Chapel, MD  Cholecalciferol 125 MCG (5000 UT) TABS daily.    [provider]  Coenzyme Q10 300 MG CAPS daily.    [provider]  cyclobenzaprine (FLEXERIL) 10 MG tablet cyclobenzaprine 10 mg tablet    [provider]  diphenhydrAMINE (BENADRYL) 25 mg capsule Take by mouth.    [provider]  ezetimibe (ZETIA) 10 MG tablet Take 10 mg by mouth daily. 01/27/20   [provider]  labetalol (NORMODYNE) 200 MG tablet  11/28/13   [provider]  levocetirizine (XYZAL) 5 MG tablet levocetirizine 5 mg tablet  TAKE 1 TABLET BY MOUTH EVERY DAY AS NEEDED    [provider]  meloxicam (MOBIC) 7.5 MG tablet meloxicam 7.5 mg tablet  Take 1 tablet every day by oral route.    [provider]  metFORMIN (GLUCOPHAGE) 500 MG tablet Take by mouth 2 (two) times daily with a meal.    [provider]  Methylcobalamin (B-12) 5000 MCG TBDP Take 1 tablet by mouth 2 (two) times a week. Take on Monday and Thursday    [provider]  neomycin-polymyxin-hydrocortisone (CORTISPORIN) OTIC solution Apply 1-2 drops to toe after soaking BID 04/20/20   Wallene Huh, DPM  pantoprazole (PROTONIX) 20 MG tablet Take 1 tablet (20 mg total) by mouth daily. 05/12/17   Blanchie Dessert, MD  potassium chloride (K-DUR) 10 MEQ tablet Take 2 tablets (20 mEq total) by mouth 2 (two) times daily. 01/21/15   Veryl Speak, MD  potassium chloride (K-DUR,KLOR-CON) 10 MEQ tablet Take by mouth once.    [provider]  potassium chloride SA (K-DUR,KLOR-CON) 20 MEQ tablet Take 20 mEq by mouth 2 (two) times daily.    [provider]  pravastatin (PRAVACHOL) 40 MG tablet  11/28/13   [provider]  SPIRONOLACTONE PO Take by mouth.    [provider]   sulfamethoxazole-trimethoprim (BACTRIM DS) 800-160 MG per tablet Take 1 tablet by mouth.    [provider]  tadalafil (CIALIS) 5 MG tablet Take by mouth. 05/09/19   [provider]  terbinafine (LAMISIL) 250 MG tablet terbinafine HCl 250 mg tablet    [provider]  triamterene-hydrochlorothiazide (DYAZIDE) 37.5-25 MG capsule Take 1 capsule by mouth daily. 02/01/20   [provider]     Physical Exam: Both excision sites are healing nicely with no signs of infection.  Dermabond is still partially intact.   Assessment: S/p excision of neck lipoma x2 doing well with no signs of infection.  Plan: He will follow-up as needed.   Radene Journey, MD

## 2020-12-13 ENCOUNTER — Emergency Department (HOSPITAL_BASED_OUTPATIENT_CLINIC_OR_DEPARTMENT_OTHER): Payer: BC Managed Care – PPO

## 2020-12-13 ENCOUNTER — Encounter (HOSPITAL_BASED_OUTPATIENT_CLINIC_OR_DEPARTMENT_OTHER): Payer: Self-pay | Admitting: Emergency Medicine

## 2020-12-13 ENCOUNTER — Other Ambulatory Visit: Payer: Self-pay

## 2020-12-13 ENCOUNTER — Emergency Department (HOSPITAL_BASED_OUTPATIENT_CLINIC_OR_DEPARTMENT_OTHER)
Admission: EM | Admit: 2020-12-13 | Discharge: 2020-12-13 | Disposition: A | Discharge status: Home / Self Care | Payer: BC Managed Care – PPO | Attending: Emergency Medicine | Admitting: Emergency Medicine

## 2020-12-13 ENCOUNTER — Other Ambulatory Visit (HOSPITAL_BASED_OUTPATIENT_CLINIC_OR_DEPARTMENT_OTHER): Payer: Self-pay

## 2020-12-13 DIAGNOSIS — Z7982 Long term (current) use of aspirin: Secondary | ICD-10-CM | POA: Insufficient documentation

## 2020-12-13 DIAGNOSIS — I2510 Atherosclerotic heart disease of native coronary artery without angina pectoris: Secondary | ICD-10-CM | POA: Insufficient documentation

## 2020-12-13 DIAGNOSIS — Z7984 Long term (current) use of oral hypoglycemic drugs: Secondary | ICD-10-CM | POA: Diagnosis not present

## 2020-12-13 DIAGNOSIS — R509 Fever, unspecified: Secondary | ICD-10-CM | POA: Diagnosis present

## 2020-12-13 DIAGNOSIS — Z951 Presence of aortocoronary bypass graft: Secondary | ICD-10-CM | POA: Diagnosis not present

## 2020-12-13 DIAGNOSIS — I10 Essential (primary) hypertension: Secondary | ICD-10-CM | POA: Diagnosis not present

## 2020-12-13 DIAGNOSIS — U071 COVID-19: Secondary | ICD-10-CM | POA: Diagnosis not present

## 2020-12-13 DIAGNOSIS — R000 Tachycardia, unspecified: Secondary | ICD-10-CM | POA: Insufficient documentation

## 2020-12-13 DIAGNOSIS — E119 Type 2 diabetes mellitus without complications: Secondary | ICD-10-CM | POA: Diagnosis not present

## 2020-12-13 DIAGNOSIS — Z79899 Other long term (current) drug therapy: Secondary | ICD-10-CM | POA: Insufficient documentation

## 2020-12-13 LAB — CBC WITH DIFFERENTIAL/PLATELET
Abs Immature Granulocytes: 0.04 10*3/uL (ref 0.00–0.07)
Basophils Absolute: 0 10*3/uL (ref 0.0–0.1)
Basophils Relative: 0 %
Eosinophils Absolute: 0 10*3/uL (ref 0.0–0.5)
Eosinophils Relative: 0 %
HCT: 40 % (ref 39.0–52.0)
Hemoglobin: 13.7 g/dL (ref 13.0–17.0)
Immature Granulocytes: 1 %
Lymphocytes Relative: 3 %
Lymphs Abs: 0.2 10*3/uL — ABNORMAL LOW (ref 0.7–4.0)
MCH: 32.9 pg (ref 26.0–34.0)
MCHC: 34.3 g/dL (ref 30.0–36.0)
MCV: 96.2 fL (ref 80.0–100.0)
Monocytes Absolute: 0.3 10*3/uL (ref 0.1–1.0)
Monocytes Relative: 3 %
Neutro Abs: 7.3 10*3/uL (ref 1.7–7.7)
Neutrophils Relative %: 93 %
Platelets: 112 10*3/uL — ABNORMAL LOW (ref 150–400)
RBC: 4.16 MIL/uL — ABNORMAL LOW (ref 4.22–5.81)
RDW: 13.2 % (ref 11.5–15.5)
WBC: 7.9 10*3/uL (ref 4.0–10.5)
nRBC: 0 % (ref 0.0–0.2)

## 2020-12-13 LAB — COMPREHENSIVE METABOLIC PANEL
ALT: 16 U/L (ref 0–44)
AST: 14 U/L — ABNORMAL LOW (ref 15–41)
Albumin: 4.3 g/dL (ref 3.5–5.0)
Alkaline Phosphatase: 57 U/L (ref 38–126)
Anion gap: 11 (ref 5–15)
BUN: 26 mg/dL — ABNORMAL HIGH (ref 8–23)
CO2: 23 mmol/L (ref 22–32)
Calcium: 9.3 mg/dL (ref 8.9–10.3)
Chloride: 103 mmol/L (ref 98–111)
Creatinine, Ser: 1.3 mg/dL — ABNORMAL HIGH (ref 0.61–1.24)
GFR, Estimated: >60 mL/min (ref >60)
Glucose, Bld: 110 mg/dL — ABNORMAL HIGH (ref 70–99)
Potassium: 4.1 mmol/L (ref 3.5–5.1)
Sodium: 137 mmol/L (ref 135–145)
Total Bilirubin: 0.5 mg/dL (ref 0.3–1.2)
Total Protein: 6.6 g/dL (ref 6.5–8.1)

## 2020-12-13 LAB — URINALYSIS, ROUTINE W REFLEX MICROSCOPIC
Bilirubin Urine: NEGATIVE
Glucose, UA: NEGATIVE mg/dL
Ketones, ur: NEGATIVE mg/dL
Nitrite: NEGATIVE
Protein, ur: NEGATIVE mg/dL
Specific Gravity, Urine: 1.02 (ref 1.005–1.030)
pH: 5 (ref 5.0–8.0)

## 2020-12-13 LAB — APTT: aPTT: 24 seconds (ref 24–36)

## 2020-12-13 LAB — RESP PANEL BY RT-PCR (FLU A&B, COVID) ARPGX2
Influenza A by PCR: NEGATIVE
Influenza B by PCR: NEGATIVE
SARS Coronavirus 2 by RT PCR: POSITIVE — AB

## 2020-12-13 LAB — URINALYSIS, MICROSCOPIC (REFLEX)

## 2020-12-13 LAB — PROTIME-INR
INR: 0.9 (ref 0.8–1.2)
Prothrombin Time: 12.4 seconds (ref 11.4–15.2)

## 2020-12-13 LAB — LACTIC ACID, PLASMA: Lactic Acid, Venous: 2.2 mmol/L (ref 0.5–1.9)

## 2020-12-13 MED ORDER — MOLNUPIRAVIR EUA 200MG CAPSULE
4.0000 | ORAL_CAPSULE | Freq: Two times a day (BID) | ORAL | 0 refills | Status: AC
  Filled 2020-12-13: qty 40, 5d supply, fill #0

## 2020-12-13 MED ORDER — SODIUM CHLORIDE 0.9 % IV SOLN
2.0000 g | Freq: Once | INTRAVENOUS | Status: AC
  Administered 2020-12-13: 2 g via INTRAVENOUS
  Filled 2020-12-13: qty 2

## 2020-12-13 MED ORDER — KETOROLAC TROMETHAMINE 15 MG/ML IJ SOLN
15.0000 mg | Freq: Once | INTRAMUSCULAR | Status: AC
  Administered 2020-12-13: 15 mg via INTRAVENOUS
  Filled 2020-12-13: qty 1

## 2020-12-13 MED ORDER — SODIUM CHLORIDE 0.9 % IV BOLUS (SEPSIS)
1000.0000 mL | Freq: Once | INTRAVENOUS | Status: AC
  Administered 2020-12-13: 1000 mL via INTRAVENOUS

## 2020-12-13 NOTE — Sepsis Progress Note (Signed)
Code sepsis protocol being monitored by eLink. 

## 2020-12-13 NOTE — ED Notes (Signed)
PT demonstrated hands on understanding of IS device- performing approximately 1700 mL.

## 2020-12-13 NOTE — ED Notes (Signed)
ED Provider at bedside. 

## 2020-12-13 NOTE — ED Provider Notes (Signed)
[facility] Provider Note   CSN: [id number] Arrival date & time: 12/13/20  3212     History Chief Complaint  Patient presents with   Fever    Jeffery Conner is a 65 y.o. male.  The history is provided by the patient.  Fever Temp source:  Subjective Severity:  Mild Onset quality:  Gradual Duration:  3 hours Timing:  Constant Progression:  Unchanged Chronicity:  New Relieved by:  Acetaminophen Worsened by:  Nothing Associated symptoms: chills, cough, dysuria (frequency) and myalgias   Associated symptoms: no chest pain, no confusion, no congestion, no diarrhea, no ear pain, no headaches, no nausea, no rash, no rhinorrhea, no somnolence, no sore throat and no vomiting   Risk factors comment:  Had cystoscopy several days ago for bladder biopsy and intravesical chemotherapy implantation     Past Medical History:  Diagnosis Date   Coronary artery disease    Diabetes mellitus without complication ([place])    Hypercholesterolemia    Hypertension     Patient Active Problem List   Diagnosis Date Noted   Abnormal glucose level 04/20/2020   Arthropathy of lumbar facet joint 04/20/2020   Benign prostatic hyperplasia with urinary obstruction 04/20/2020   Hyperlipidemia 04/20/2020   Hypertensive heart disease 04/20/2020   Obesity with body mass index 30 or greater 04/20/2020   Vitamin D deficiency 04/20/2020   Erectile dysfunction due to arterial insufficiency 05/09/2019   ED (erectile dysfunction) of organic origin 12/16/2013   Coronary heart disease 04/15/2011   Essential hypertension 04/15/2011   Status post aortic valve replacement with stentless valve 04/15/2011    Past Surgical History:  Procedure Laterality Date   AORTIC VALVE REPLACEMENT     CORONARY ARTERY BYPASS GRAFT     CORONARY STENT PLACEMENT         History reviewed. No pertinent family history.  Social History   Tobacco Use   Smoking status: Never   Smokeless  tobacco: Never  Substance Use Topics   Alcohol use: No   Drug use: No    Home Medications Prior to Admission medications   Medication Sig Start Date End Date Taking? Authorizing Provider  molnupiravir EUA 200 mg CAPS Take 4 capsules (800 mg total) by mouth 2 (two) times daily for 5 days. 12/13/20 12/18/20 Yes Reeta Kuk, DO  albuterol (PROAIR HFA) 108 (90 Base) MCG/ACT inhaler ProAir HFA 90 mcg/actuation aerosol inhaler    [provider]  amLODipine (NORVASC) 10 MG tablet Take 10 mg by mouth daily.    [provider]  amLODipine (NORVASC) 10 MG tablet Take 1 tablet (10 mg total) by mouth once. 06/19/13   Rancour, Annie Main, MD  aspirin 325 MG EC tablet Take 81 mg by mouth daily.     [provider]  atorvastatin (LIPITOR) 10 MG tablet Take by mouth daily.    [provider]  Azilsartan Medoxomil (EDARBI PO) Take by mouth.    [provider]  bacitracin ointment Apply topically 2 (two) times daily. 02/29/12   Noemi Chapel, MD  Cholecalciferol 125 MCG (5000 UT) TABS daily.    [provider]  Coenzyme Q10 300 MG CAPS daily.    [provider]  cyclobenzaprine (FLEXERIL) 10 MG tablet cyclobenzaprine 10 mg tablet    [provider]  diphenhydrAMINE (BENADRYL) 25 mg capsule Take by mouth.    [provider]  ezetimibe (ZETIA) 10 MG tablet Take 10 mg by mouth daily. 01/27/20   [provider]  labetalol (NORMODYNE) 200 MG tablet  11/28/13   [provider]  levocetirizine (XYZAL) 5 MG tablet levocetirizine 5 mg tablet  TAKE 1 TABLET BY MOUTH EVERY DAY AS NEEDED    [provider]  meloxicam (MOBIC) 7.5 MG tablet meloxicam 7.5 mg tablet  Take 1 tablet every day by oral route.    [provider]  metFORMIN (GLUCOPHAGE) 500 MG tablet Take by mouth 2 (two) times daily with a meal.    [provider]  Methylcobalamin (B-12) 5000 MCG TBDP Take 1 tablet by mouth 2 (two) times a  week. Take on Monday and Thursday    [provider]  neomycin-polymyxin-hydrocortisone (CORTISPORIN) OTIC solution Apply 1-2 drops to toe after soaking BID 04/20/20   Wallene Huh, DPM  pantoprazole (PROTONIX) 20 MG tablet Take 1 tablet (20 mg total) by mouth daily. 05/12/17   Blanchie Dessert, MD  potassium chloride (K-DUR) 10 MEQ tablet Take 2 tablets (20 mEq total) by mouth 2 (two) times daily. 01/21/15   Veryl Speak, MD  potassium chloride (K-DUR,KLOR-CON) 10 MEQ tablet Take by mouth once.    [provider]  potassium chloride SA (K-DUR,KLOR-CON) 20 MEQ tablet Take 20 mEq by mouth 2 (two) times daily.    [provider]  pravastatin (PRAVACHOL) 40 MG tablet  11/28/13   [provider]  SPIRONOLACTONE PO Take by mouth.    [provider]  sulfamethoxazole-trimethoprim (BACTRIM DS) 800-160 MG per tablet Take 1 tablet by mouth.    [provider]  tadalafil (CIALIS) 5 MG tablet Take by mouth. 05/09/19   [provider]  terbinafine (LAMISIL) 250 MG tablet terbinafine HCl 250 mg tablet    [provider]  triamterene-hydrochlorothiazide (DYAZIDE) 37.5-25 MG capsule Take 1 capsule by mouth daily. 02/01/20   [provider]    Allergies    Penicillins and Nitrates, organic  Review of Systems   Review of Systems  Constitutional:  Positive for chills and fever.  HENT:  Negative for congestion, ear pain, rhinorrhea and sore throat.   Eyes:  Negative for pain and visual disturbance.  Respiratory:  Positive for cough. Negative for shortness of breath.   Cardiovascular:  Negative for chest pain and palpitations.  Gastrointestinal:  Negative for abdominal pain, diarrhea, nausea and vomiting.  Genitourinary:  Positive for dysuria (frequency), frequency and urgency. Negative for hematuria.  Musculoskeletal:  Positive for myalgias. Negative for arthralgias and back pain.  Skin:  Negative for color change and rash.   Neurological:  Negative for seizures, syncope and headaches.  Psychiatric/Behavioral:  Negative for confusion.   All other systems reviewed and are negative.  Physical Exam Updated Vital Signs BP (!) 136/91   Pulse (!) 105   Temp (!) 100.8 F (38.2 C) (Oral)   Resp 20   Ht 6' (1.829 m)   Wt 99.8 kg   SpO2 91%   BMI 29.84 kg/m   Physical Exam Vitals and nursing note reviewed.  Constitutional:      General: He is not in acute distress.    Appearance: He is well-developed. He is ill-appearing.  HENT:     Head: Normocephalic and atraumatic.  Eyes:     Extraocular Movements: Extraocular movements intact.     Conjunctiva/sclera: Conjunctivae normal.     Pupils: Pupils are equal, round, and reactive to light.  Cardiovascular:     Rate and Rhythm: Normal rate and regular rhythm.     Pulses: Normal pulses.  Heart sounds: Normal heart sounds. No murmur heard. Pulmonary:     Effort: Pulmonary effort is normal. No respiratory distress.     Breath sounds: Normal breath sounds.  Abdominal:     General: There is no distension.     Palpations: Abdomen is soft.     Tenderness: There is no abdominal tenderness.  Musculoskeletal:     Cervical back: Neck supple.  Skin:    General: Skin is warm and dry.     Capillary Refill: Capillary refill takes less than 2 seconds.  Neurological:     General: No focal deficit present.     Mental Status: He is alert.  Psychiatric:        Mood and Affect: Mood normal.    ED Results / Procedures / Treatments   Labs (all labs ordered are listed, but only abnormal results are displayed) Labs Reviewed  RESP PANEL BY RT-PCR (FLU A&B, COVID) ARPGX2 - Abnormal; Notable for the following components:      Result Value   SARS Coronavirus 2 by RT PCR POSITIVE (*)    All other components within normal limits  URINALYSIS, ROUTINE W REFLEX MICROSCOPIC - Abnormal; Notable for the following components:   Hgb urine dipstick LARGE (*)    Leukocytes,Ua  TRACE (*)    All other components within normal limits  URINALYSIS, MICROSCOPIC (REFLEX) - Abnormal; Notable for the following components:   Bacteria, UA RARE (*)    All other components within normal limits  CBC WITH DIFFERENTIAL/PLATELET - Abnormal; Notable for the following components:   RBC 4.16 (*)    Platelets 112 (*)    Lymphs Abs 0.2 (*)    All other components within normal limits  COMPREHENSIVE METABOLIC PANEL - Abnormal; Notable for the following components:   Glucose, Bld 110 (*)    BUN 26 (*)    Creatinine, Ser 1.30 (*)    AST 14 (*)    All other components within normal limits  CULTURE, BLOOD (ROUTINE X 2)  CULTURE, BLOOD (ROUTINE X 2)  URINE CULTURE  PROTIME-INR  APTT  LACTIC ACID, PLASMA  LACTIC ACID, PLASMA  CBC WITH DIFFERENTIAL/PLATELET  COMPREHENSIVE METABOLIC PANEL  CBC WITH DIFFERENTIAL/PLATELET    EKG EKG Interpretation  Date/Time:  Thursday December 13 2020 10:04:25 EDT Ventricular Rate:  101 PR Interval:  200 QRS Duration: 100 QT Interval:  325 QTC Calculation: 422 R Axis:   -47 Text Interpretation: Sinus tachycardia LAD, consider left anterior fascicular block Abnormal R-wave progression, early transition Left ventricular hypertrophy Confirmed by Lennice Sites ([id number]) on 12/13/2020 10:08:25 AM  Radiology DG Chest Port 1 View  Result Date: 12/13/2020 CLINICAL DATA:  Body chills, shaking, fever EXAM: PORTABLE CHEST 1 VIEW COMPARISON:  06/04/2019 chest radiograph. FINDINGS: Intact sternotomy wires. Stable cardiomediastinal silhouette with mild cardiomegaly. No pneumothorax. No pleural effusion. Increased moderate elevation of the left hemidiaphragm. Mild left basilar atelectasis. No pulmonary edema. No acute consolidative airspace disease. IMPRESSION: 1. Stable mild cardiomegaly without pulmonary edema. 2. Increased moderate elevation of the left hemidiaphragm with mild left basilar atelectasis. No acute consolidative airspace disease. Electronically Signed    By: Ilona Sorrel M.D.   On: 12/13/2020 10:29    Procedures Procedures   Medications Ordered in ED Medications  sodium chloride 0.9 % bolus 1,000 mL (1,000 mLs Intravenous New Bag/Given 12/13/20 1039)  ceFEPIme (MAXIPIME) 2 g in sodium chloride 0.9 % 100 mL IVPB (0 g Intravenous Stopped 12/13/20 1127)  ketorolac (TORADOL) 15 MG/ML injection 15 mg (  15 mg Intravenous Given 12/13/20 1128)    ED Course  I have reviewed the triage vital signs and the nursing notes.  Pertinent labs & imaging results that were available during my care of the patient were reviewed by me and considered in my medical decision making (see chart for details).    MDM Rules/Calculators/A&P                          DURWARD MATRANGA is here with fever and chills.  Patient febrile and tachycardic upon arrival.  Had cystoscopy done several days ago for bladder biopsy for presumed bladder cancer.  Had implantation of intravesical chemotherapy.  Has not had any hematuria.  Is having urinary frequency.  Has had a cough.  Denies any abdominal pain.  States he took Tylenol prior to arrival.  Overall concern for sepsis given fever and tachycardia.  Will cover with IV cefepime given that he has had recent GU instrumentation and localized chemotherapy of his bladder.  He has had a nondescript cough but was under general anesthesia as well.  We will check for COVID and pneumonia as well.  Will give fluid bolus.  Anticipate admission as suspect he is high risk for sepsis/atypical organism if UTI.  Patient prefers admission at [facility] but when we call they are at capacity.  Overall chest x-ray without evidence of infection.  Urinalysis without evidence of infection.  No significant leukocytosis, lactic acidosis.  Patient positive for COVID.  We will start him on molnupivar.  No hypoxia both at rest and with ambulation.  Would like to be discharged home which I think is reasonable.  Discharged in good condition.   Understands return precautions.  This chart was dictated using voice recognition software.  Despite best efforts to proofread,  errors can occur which can change the documentation meaning.   Final Clinical Impression(s) / ED Diagnoses Final diagnoses:  COVID-19    Rx / DC Orders ED Discharge Orders          Ordered    molnupiravir EUA 200 mg CAPS  2 times daily        12/13/20 1146             Eakly, Quita Skye, DO 12/13/20 1148

## 2020-12-13 NOTE — ED Triage Notes (Signed)
Patient complaining of body chills/shaking. Believes symptoms could be related to recent bladder biopsy.

## 2020-12-14 LAB — BLOOD CULTURE ID PANEL (REFLEXED) - BCID2

## 2020-12-14 NOTE — ED Notes (Signed)
Patient called back and was informed that he needed to be admitted per Dr. Theora Gianotti, He was informed to go to either [facility] or [facility] ED to be admitted.

## 2020-12-14 NOTE — ED Notes (Signed)
Patient called and made aware that cultures were positive and if he did not feel better to come back in to be re-evaluated per Dr. Griselda Miner.

## 2020-12-15 LAB — URINE CULTURE: Culture: >=100000 — AB

## 2020-12-16 ENCOUNTER — Telehealth: Payer: Self-pay | Admitting: Emergency Medicine

## 2020-12-16 LAB — CULTURE, BLOOD (ROUTINE X 2)
Special Requests: ADEQUATE
Special Requests: ADEQUATE

## 2020-12-16 NOTE — Progress Notes (Signed)
ED Antimicrobial Stewardship Positive Culture Follow Up   Jeffery Conner is an 65 y.o. male who presented to [facility] on 12/13/2020 with a chief complaint of  Chief Complaint  Patient presents with   Fever    Recent Results (from the past 720 hour(s))  Urine Culture     Status: Abnormal   Collection Time: 12/13/20  9:55 AM   Specimen: In/Out Cath Urine  Result Value Ref Range Status   Specimen Description   Final    IN/OUT CATH URINE Performed at [facility], [address] [place], [place] [address]    Special Requests   Final    NONE Performed at [facility], [address] [place], [place] [address]    Culture (A)  Final    >=100,000 COLONIES/mL ESCHERICHIA COLI Confirmed Extended Spectrum Beta-Lactamase Producer (ESBL).  In bloodstream infections from ESBL organisms, carbapenems are preferred over piperacillin/tazobactam. They are shown to have a lower risk of mortality.    Report Status 12/15/2020 FINAL  Final   Organism ID, Bacteria ESCHERICHIA COLI (A)  Final      Susceptibility   Escherichia coli - MIC*    AMPICILLIN >=32 RESISTANT Resistant     CEFAZOLIN >=64 RESISTANT Resistant     CEFEPIME 16 RESISTANT Resistant     CEFTRIAXONE >=64 RESISTANT Resistant     CIPROFLOXACIN >=4 RESISTANT Resistant     GENTAMICIN >=16 RESISTANT Resistant     IMIPENEM <=0.25 SENSITIVE Sensitive     NITROFURANTOIN <=16 SENSITIVE Sensitive     TRIMETH/SULFA >=320 RESISTANT Resistant     AMPICILLIN/SULBACTAM >=32 RESISTANT Resistant     PIP/TAZO 8 SENSITIVE Sensitive     * >=100,000 COLONIES/mL ESCHERICHIA COLI  Resp Panel by RT-PCR (Flu A&B, Covid) Nasopharyngeal Swab     Status: Abnormal   Collection Time: 12/13/20 10:10 AM   Specimen: Nasopharyngeal Swab; Nasopharyngeal(NP) swabs in vial transport medium  Result Value Ref Range Status   SARS Coronavirus 2 by RT PCR POSITIVE (A) NEGATIVE Final    Comment: RESULT CALLED TO, READ BACK BY AND VERIFIED  WITH: SAM COBLE,RN AT 1135 12/13/20 BY K BARR (NOTE) SARS-CoV-2 target nucleic acids are DETECTED.  The SARS-CoV-2 RNA is generally detectable in upper respiratory specimens during the acute phase of infection. Positive results are indicative of the presence of the identified virus, but do not rule out bacterial infection or co-infection with other pathogens not detected by the test. Clinical correlation with patient history and other diagnostic information is necessary to determine patient infection status. The expected result is Negative.  Fact Sheet for Patients: EntrepreneurPulse.com.au  Fact Sheet for Healthcare Providers: IncredibleEmployment.be  This test is not yet approved or cleared by the [place] FDA and  has been authorized for detection and/or diagnosis of SARS-CoV-2 by FDA under an Emergency Use Authorization (EUA).  This EUA will remain in effect (meaning this test c an be used) for the duration of  the COVID-19 declaration under Section 564(b)(1) of the Act, 21 U.S.C. section 360bbb-3(b)(1), unless the authorization is terminated or revoked sooner.     Influenza A by PCR NEGATIVE NEGATIVE Final   Influenza B by PCR NEGATIVE NEGATIVE Final    Comment: (NOTE) The Xpert Xpress SARS-CoV-2/FLU/RSV plus assay is intended as an aid in the diagnosis of influenza from Nasopharyngeal swab specimens and should not be used as a sole basis for treatment. Nasal washings and aspirates are unacceptable for Xpert Xpress SARS-CoV-2/FLU/RSV testing.  Fact Sheet for Patients: EntrepreneurPulse.com.au  Fact Sheet for Healthcare Providers: IncredibleEmployment.be  This test is not yet approved or cleared by the [place] FDA and has been authorized for detection and/or diagnosis of SARS-CoV-2 by FDA under an Emergency Use Authorization (EUA). This EUA will remain in effect (meaning this test can be  used) for the duration of the COVID-19 declaration under Section 564(b)(1) of the Act, 21 U.S.C. section 360bbb-3(b)(1), unless the authorization is terminated or revoked.  Performed at [facility], [address] [place], [place] [address]   Blood Culture (routine x 2)     Status: Abnormal (Preliminary result)   Collection Time: 12/13/20 10:10 AM   Specimen: BLOOD  Result Value Ref Range Status   Specimen Description   Final    BLOOD RIGHT ANTECUBITAL Performed at [facility], [address] [place], [place] [address]    Special Requests   Final    BOTTLES DRAWN AEROBIC AND ANAEROBIC Blood Culture adequate volume Performed at [facility], [address] [place], [place] [address]    Culture  Setup Time   Final    GRAM NEGATIVE RODS IN BOTH AEROBIC AND ANAEROBIC BOTTLES CRITICAL RESULT CALLED TO, READ BACK BY AND VERIFIED WITH: L. ADKINS,CHARGE RN [id number] 12/14/2020 Mena Goes Performed at [facility], [place] [address] [place], [place] [address]    Culture (A)  Final    ESCHERICHIA COLI Confirmed Extended Spectrum Beta-Lactamase Producer (ESBL).  In bloodstream infections from ESBL organisms, carbapenems are preferred over piperacillin/tazobactam. They are shown to have a lower risk of mortality.    Report Status PENDING  Incomplete   Organism ID, Bacteria ESCHERICHIA COLI  Final      Susceptibility   Escherichia coli - MIC*    AMPICILLIN >=32 RESISTANT Resistant     CEFAZOLIN >=64 RESISTANT Resistant     CEFEPIME >=32 RESISTANT Resistant     CEFTAZIDIME RESISTANT Resistant     CEFTRIAXONE >=64 RESISTANT Resistant     CIPROFLOXACIN >=4 RESISTANT Resistant     GENTAMICIN >=16 RESISTANT Resistant     IMIPENEM <=0.25 SENSITIVE Sensitive     TRIMETH/SULFA >=320 RESISTANT Resistant     AMPICILLIN/SULBACTAM >=32 RESISTANT Resistant     PIP/TAZO 8 SENSITIVE Sensitive     * ESCHERICHIA COLI  Blood Culture ID Panel (Reflexed)     Status:  Abnormal   Collection Time: 12/13/20 10:10 AM  Result Value Ref Range Status   Enterococcus faecalis NOT DETECTED NOT DETECTED Final   Enterococcus Faecium NOT DETECTED NOT DETECTED Final   Listeria monocytogenes NOT DETECTED NOT DETECTED Final   Staphylococcus species NOT DETECTED NOT DETECTED Final   Staphylococcus aureus (BCID) NOT DETECTED NOT DETECTED Final   Staphylococcus epidermidis NOT DETECTED NOT DETECTED Final   Staphylococcus lugdunensis NOT DETECTED NOT DETECTED Final   Streptococcus species NOT DETECTED NOT DETECTED Final   Streptococcus agalactiae NOT DETECTED NOT DETECTED Final   Streptococcus pneumoniae NOT DETECTED NOT DETECTED Final   Streptococcus pyogenes NOT DETECTED NOT DETECTED Final   A.calcoaceticus-baumannii NOT DETECTED NOT DETECTED Final   Bacteroides fragilis NOT DETECTED NOT DETECTED Final   Enterobacterales DETECTED (A) NOT DETECTED Final    Comment: Enterobacterales represent a large order of gram negative bacteria, not a single organism. CRITICAL RESULT CALLED TO, READ BACK BY AND VERIFIED WITH: L. ADKINS,CHARGE RN [id number] 12/14/2020 T. TYSOR    Enterobacter cloacae complex NOT DETECTED NOT DETECTED Final  Escherichia coli DETECTED (A) NOT DETECTED Final    Comment: CRITICAL RESULT CALLED TO, READ BACK BY AND VERIFIED WITH: L. ADKINS,CHARGE RN [phone] 12/14/2020 T. TYSOR    Klebsiella aerogenes NOT DETECTED NOT DETECTED Final   Klebsiella oxytoca NOT DETECTED NOT DETECTED Final   Klebsiella pneumoniae NOT DETECTED NOT DETECTED Final   Proteus species NOT DETECTED NOT DETECTED Final   Salmonella species NOT DETECTED NOT DETECTED Final   Serratia marcescens NOT DETECTED NOT DETECTED Final   Haemophilus influenzae NOT DETECTED NOT DETECTED Final   Neisseria meningitidis NOT DETECTED NOT DETECTED Final   Pseudomonas aeruginosa NOT DETECTED NOT DETECTED Final   Stenotrophomonas maltophilia NOT DETECTED NOT DETECTED Final   Candida albicans NOT DETECTED NOT  DETECTED Final   Candida auris NOT DETECTED NOT DETECTED Final   Candida glabrata NOT DETECTED NOT DETECTED Final   Candida krusei NOT DETECTED NOT DETECTED Final   Candida parapsilosis NOT DETECTED NOT DETECTED Final   Candida tropicalis NOT DETECTED NOT DETECTED Final   Cryptococcus neoformans/gattii NOT DETECTED NOT DETECTED Final   CTX-M ESBL DETECTED (A) NOT DETECTED Final    Comment: CRITICAL RESULT CALLED TO, READ BACK BY AND VERIFIED WITH: L. ADKINS,CHARGE RN [id number] 12/14/2020 T. TYSOR (NOTE) Extended spectrum beta-lactamase detected. Recommend a carbapenem as initial therapy.      Carbapenem resistance IMP NOT DETECTED NOT DETECTED Final   Carbapenem resistance KPC NOT DETECTED NOT DETECTED Final   Carbapenem resistance NDM NOT DETECTED NOT DETECTED Final   Carbapenem resist OXA 48 LIKE NOT DETECTED NOT DETECTED Final   Carbapenem resistance VIM NOT DETECTED NOT DETECTED Final    Comment: Performed at [facility], [place] [address], [place], [place] [address]  Blood Culture (routine x 2)     Status: Abnormal (Preliminary result)   Collection Time: 12/13/20 10:45 AM   Specimen: BLOOD RIGHT HAND  Result Value Ref Range Status   Specimen Description   Final    BLOOD RIGHT HAND Performed at [facility], [address] [place], [place] [address]    Special Requests   Final    BOTTLES DRAWN AEROBIC AND ANAEROBIC Blood Culture adequate volume Performed at [facility], [address] [place], [place] [address]    Culture  Setup Time   Final    GRAM NEGATIVE RODS IN BOTH AEROBIC AND ANAEROBIC BOTTLES CRITICAL RESULT CALLED TO, READ BACK BY AND VERIFIED WITH: L. ADKINS,CHARGE RN [id number] 12/14/2020 T. TYSOR    Culture (A)  Final    ESCHERICHIA COLI SUSCEPTIBILITIES PERFORMED ON PREVIOUS CULTURE WITHIN THE LAST 5 DAYS. Performed at [facility], [place] [address] [place], [place] [address]    Report Status PENDING  Incomplete   Patient  previously instructed to return to hospital for admission given blood culture findings (See RN Rhew note from 7/14). I do not see where patient has returned for admission.  Recommend re-contacting patient to have him return for admission, unless being treated at another facility. Patient's findings are concerning for ESBL bacteremia and UTI requiring IV antibiotics.  ED Provider: Suella Broad, [facility]  Lorelei Pont, PharmD, BCPS 12/16/2020 11:14 AM ED Clinical Pharmacist -  [phone]

## 2020-12-16 NOTE — Telephone Encounter (Signed)
Post ED Visit - Positive Culture Follow-up: Successful Patient Follow-Up  Culture assessed and recommendations reviewed by:  []  Elenor Quinones, Pharm.D. []  Heide Guile, Pharm.D., BCPS AQ-ID []  Parks Neptune, Pharm.D., BCPS []  Alycia Rossetti, Pharm.D., BCPS []  [place], Pharm.D., BCPS, AAHIVP []  Legrand Como, Pharm.D., BCPS, AAHIVP []  Salome Arnt, PharmD, BCPS []  Johnnette Gourd, PharmD, BCPS []  Hughes Better, PharmD, BCPS [x]  Lorelei Pont, PharmD  Positive urine culture  []  Patient discharged without antimicrobial prescription and treatment is now indicated []  Organism is resistant to prescribed ED discharge antimicrobial []  Patient with positive blood cultures  Changes discussed with ED provider: Suella Broad, PA   Contacted patient, date 12/16/2020, time 1400 Patient reports he is currently admitted to [facility] for positive blood cultures.    Sandi Raveling Letecia Arps 12/16/2020, 4:54 PM

## 2022-09-15 IMAGING — CT CT MAXILLOFACIAL W/O CM
3 of 6 series · 15 of 47 positions shown, 18 images · non-contrast
Comparison: None.

CLINICAL DATA: Postnasal drainage and congestion for 6 months.

EXAM:
CT MAXILLOFACIAL WITHOUT CONTRAST
TECHNIQUE: Multidetector CT imaging of the maxillofacial structures was
performed. Multiplanar CT image reconstructions were also generated.

[Series 2: maxillofacial 2.00 hr60 s3 axial · axial · 0.35mm/px · z∈[-658,-540]mm · 10 of 69 slices shown, 13 images]
[im 5/69  brain]
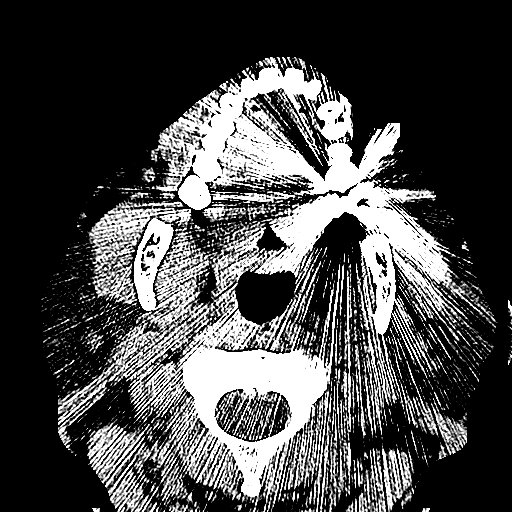
[im 5/69  bone]
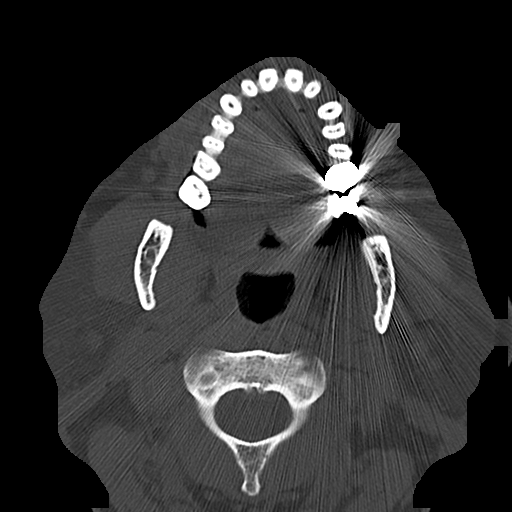
[im 10/69  bone]
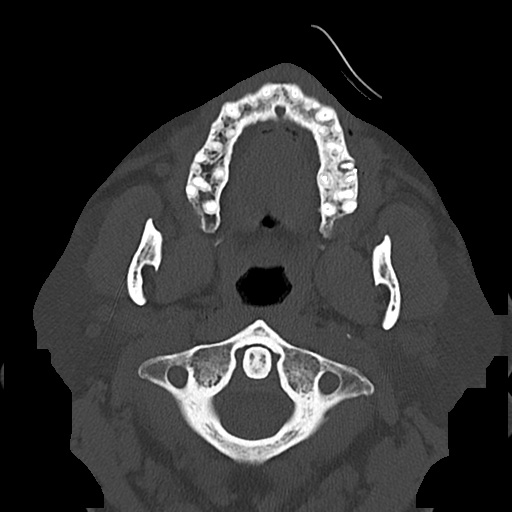
[im 20/69  bone]
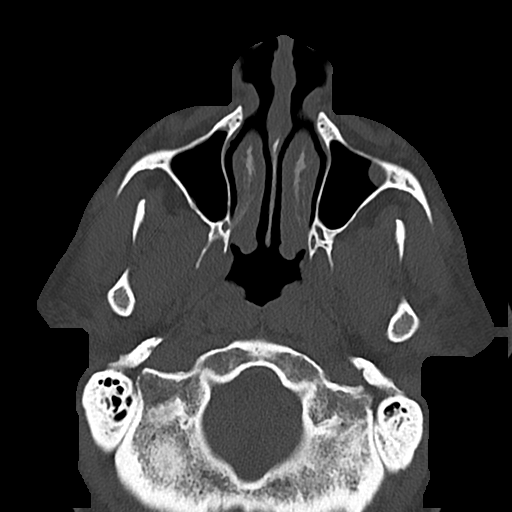
[im 25/69  bone]
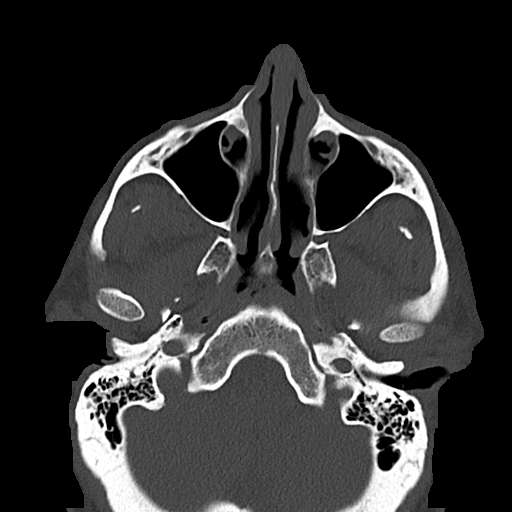
[im 30/69  brain]
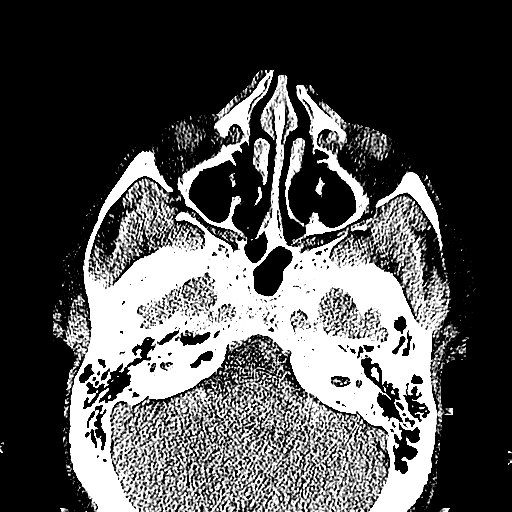
[im 30/69  bone]
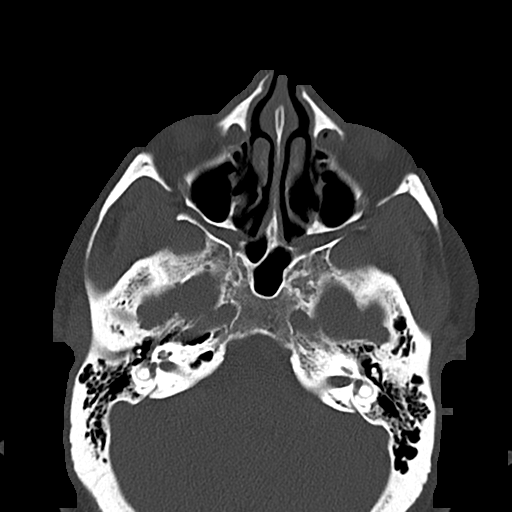
[im 39/69  bone]
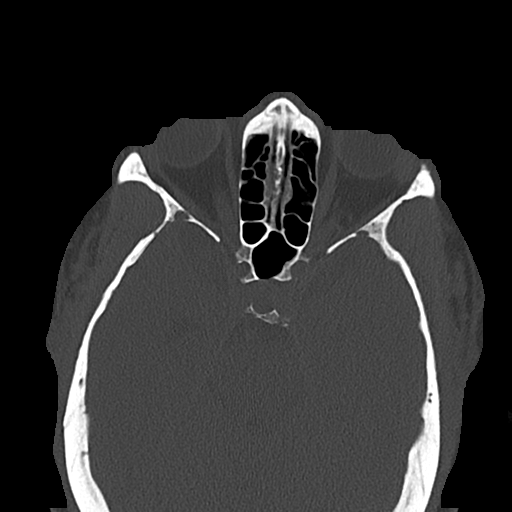
[im 44/69  bone]
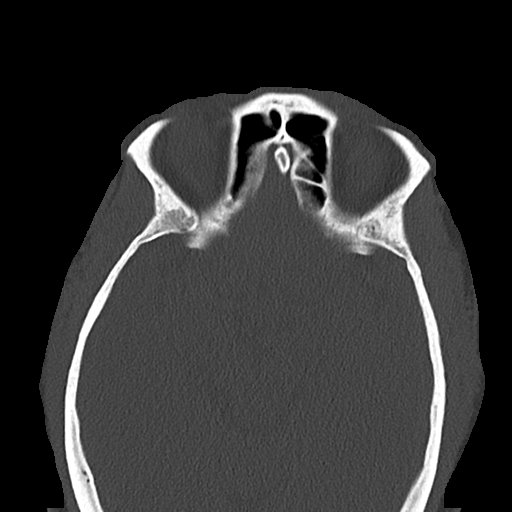
[im 49/69  bone]
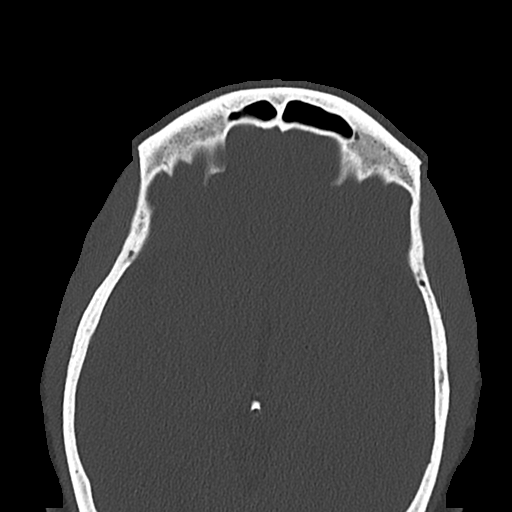
[im 59/69  brain]
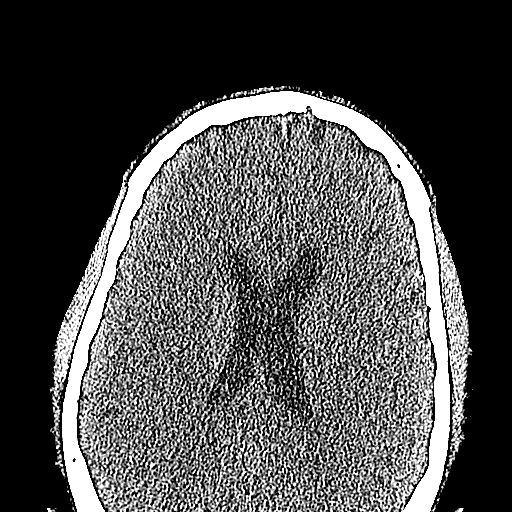
[im 59/69  bone]
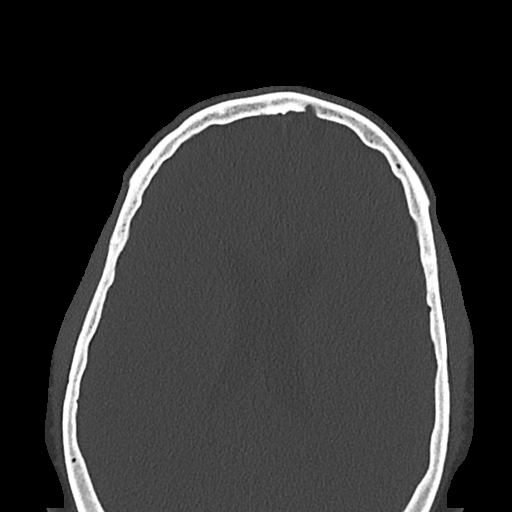
[im 64/69  bone]
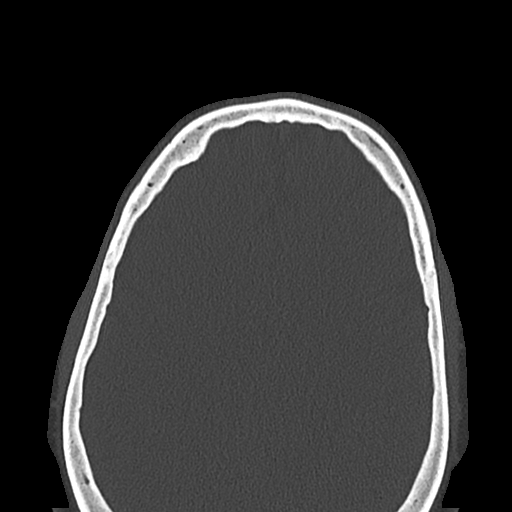

[Series 6: maxillofacial 2.00 hr60 s3 cor · coronal · 0.27mm/px · 3 of 89 slices shown]
[im 23/89  bone]
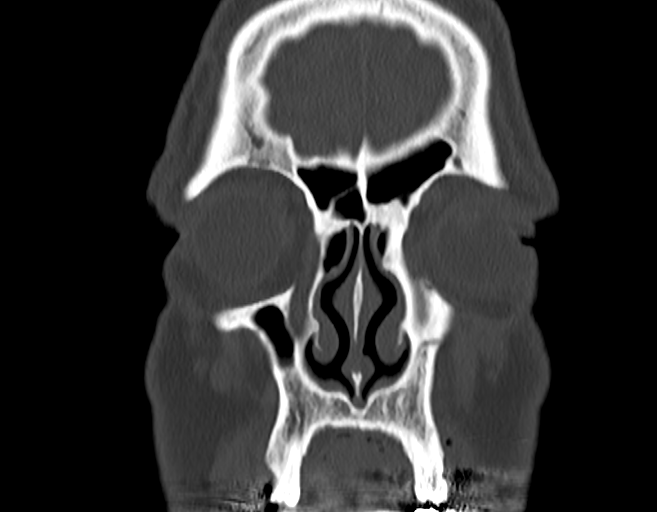
[im 45/89  bone]
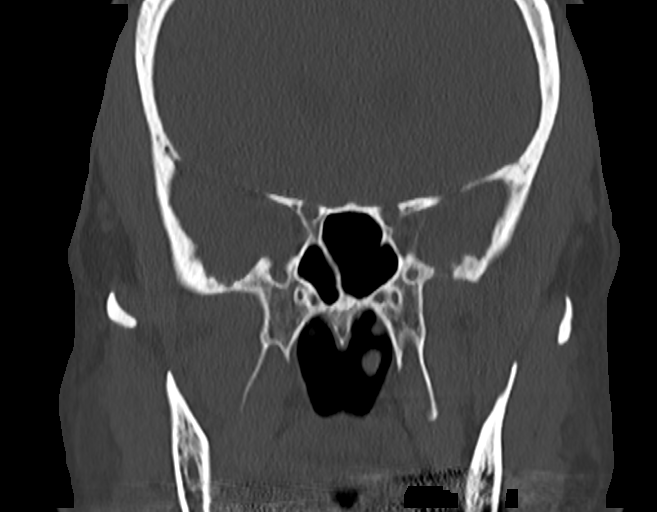
[im 67/89  bone]
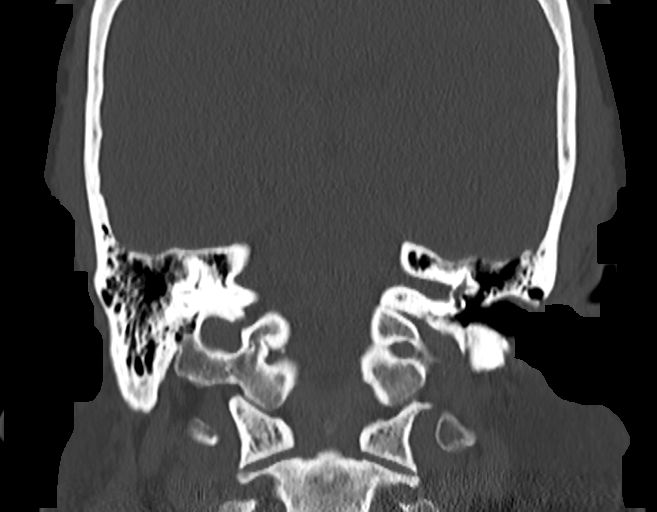

[Series 12: maxillofacial 2.00 hr40 s3 sag · sagittal · 0.27mm/px · 2 of 89 slices shown]
[im 30/89  bone]
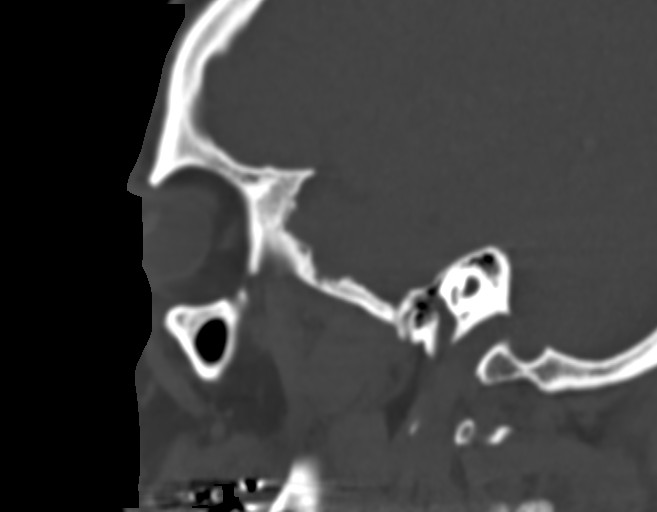
[im 59/89  bone]
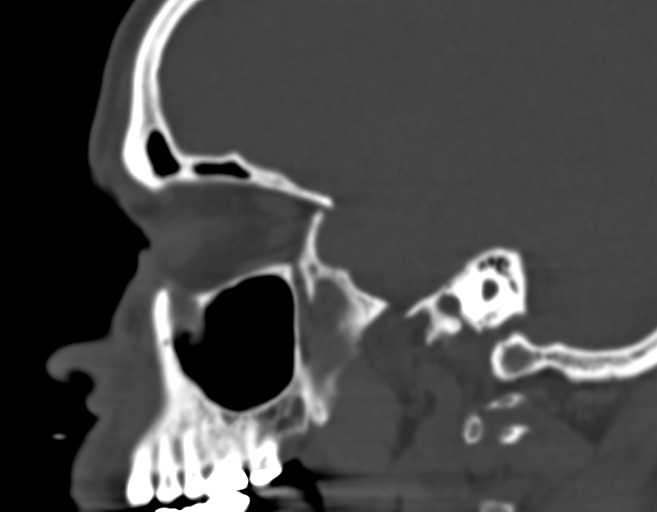

[15 of 47 positions shown; findings below may reference images not displayed]

FINDINGS: Osseous: No fracture or mandibular dislocation. No destructive
process.

Dental: Periapical lucency of the left maxillary second molar with
overlying buccal cortical dehiscence.

Orbits: Negative. No traumatic or inflammatory finding.

Sinuses: Mild mucosal thickening of the inferior maxillary sinuses
and small left maxillary sinus retention cyst. Mild scattered
ethmoid air cells without air-fluid levels. Remaining sinuses are
clear. Bilateral ostiomeatal units are clear. Fluid in bilateral
nasolacrimal ducts.

Soft tissues: Negative.

Limited intracranial: No significant or unexpected finding.
IMPRESSION: Mild mucosal thickening of the inferior maxillary sinuses and
scattered ethmoid air cells without air-fluid levels. Remaining
sinuses are clear.

## 2023-06-11 ENCOUNTER — Emergency Department (HOSPITAL_BASED_OUTPATIENT_CLINIC_OR_DEPARTMENT_OTHER)
Admission: EM | Admit: 2023-06-11 | Discharge: 2023-06-11 | Disposition: A | Discharge status: Home / Self Care | Payer: BC Managed Care – PPO | Attending: Emergency Medicine | Admitting: Emergency Medicine

## 2023-06-11 ENCOUNTER — Other Ambulatory Visit: Payer: Self-pay

## 2023-06-11 ENCOUNTER — Emergency Department (HOSPITAL_BASED_OUTPATIENT_CLINIC_OR_DEPARTMENT_OTHER): Payer: BC Managed Care – PPO

## 2023-06-11 ENCOUNTER — Encounter (HOSPITAL_BASED_OUTPATIENT_CLINIC_OR_DEPARTMENT_OTHER): Payer: Self-pay

## 2023-06-11 DIAGNOSIS — G4730 Sleep apnea, unspecified: Secondary | ICD-10-CM

## 2023-06-11 DIAGNOSIS — I10 Essential (primary) hypertension: Secondary | ICD-10-CM | POA: Diagnosis not present

## 2023-06-11 DIAGNOSIS — R0602 Shortness of breath: Secondary | ICD-10-CM | POA: Diagnosis present

## 2023-06-11 DIAGNOSIS — I2510 Atherosclerotic heart disease of native coronary artery without angina pectoris: Secondary | ICD-10-CM | POA: Diagnosis not present

## 2023-06-11 DIAGNOSIS — Z79899 Other long term (current) drug therapy: Secondary | ICD-10-CM | POA: Insufficient documentation

## 2023-06-11 DIAGNOSIS — Z7982 Long term (current) use of aspirin: Secondary | ICD-10-CM | POA: Insufficient documentation

## 2023-06-11 LAB — CBC
HCT: 43.1 % (ref 39.0–52.0)
Hemoglobin: 14.2 g/dL (ref 13.0–17.0)
MCH: 31.6 pg (ref 26.0–34.0)
MCHC: 32.9 g/dL (ref 30.0–36.0)
MCV: 96 fL (ref 80.0–100.0)
Platelets: 126 10*3/uL — ABNORMAL LOW (ref 150–400)
RBC: 4.49 MIL/uL (ref 4.22–5.81)
RDW: 13.2 % (ref 11.5–15.5)
WBC: 4.7 10*3/uL (ref 4.0–10.5)
nRBC: 0 % (ref 0.0–0.2)

## 2023-06-11 LAB — URINALYSIS, ROUTINE W REFLEX MICROSCOPIC
Bilirubin Urine: NEGATIVE
Glucose, UA: NEGATIVE mg/dL
Hgb urine dipstick: NEGATIVE
Ketones, ur: NEGATIVE mg/dL
Leukocytes,Ua: NEGATIVE
Nitrite: NEGATIVE
Protein, ur: NEGATIVE mg/dL
Specific Gravity, Urine: 1.02 (ref 1.005–1.030)
pH: 6 (ref 5.0–8.0)

## 2023-06-11 LAB — BASIC METABOLIC PANEL
Anion gap: 9 (ref 5–15)
BUN: 23 mg/dL (ref 8–23)
CO2: 23 mmol/L (ref 22–32)
Calcium: 9.4 mg/dL (ref 8.9–10.3)
Chloride: 108 mmol/L (ref 98–111)
Creatinine, Ser: 1.12 mg/dL (ref 0.61–1.24)
GFR, Estimated: >60 mL/min (ref >60)
Glucose, Bld: 97 mg/dL (ref 70–99)
Potassium: 4.2 mmol/L (ref 3.5–5.1)
Sodium: 140 mmol/L (ref 135–145)

## 2023-06-11 LAB — BRAIN NATRIURETIC PEPTIDE: B Natriuretic Peptide: 107.2 pg/mL — ABNORMAL HIGH (ref 0.0–100.0)

## 2023-06-11 NOTE — Discharge Instructions (Addendum)
 Please follow-up for your sleep study and with your primary care doctor for further evaluation of your symptoms.  You can follow-up with your oncologist related to your urine, there is nothing abnormal in your urine today.

## 2023-06-11 NOTE — ED Triage Notes (Signed)
 Pt states wife sent him to the hospital because at night she hears wheezing and thinks pt might be sob while he is sleeping. Pt denies any sob or wheezing currently. Denies any chest pain.

## 2023-06-11 NOTE — ED Provider Notes (Signed)
 [facility] Provider Note   CSN: [id number] Arrival date & time: 06/11/23  1304     History  Chief Complaint  Patient presents with   Shortness of Breath    Jeffery Conner is a 68 y.o. male with past medical history significant for coronary heart disease, hypertension, hyperlipidemia, history of aortic valve replacement who presents after concern raised by wife that over the last week he has had some wheezing/gurgling sound while sleeping.  Patient denies any shortness of breath, chest pain, wheezing.  He denies any history of fluid overload since his heart valve replacement.  He denies feeling short of breath while laying flat.  He denies any daytime sleepiness.   Shortness of Breath      Home Medications Prior to Admission medications   Medication Sig Start Date End Date Taking? Authorizing Provider  albuterol (PROAIR HFA) 108 (90 Base) MCG/ACT inhaler ProAir HFA 90 mcg/actuation aerosol inhaler    [provider]  amLODipine  (NORVASC ) 10 MG tablet Take 10 mg by mouth daily.    [provider]  amLODipine  (NORVASC ) 10 MG tablet Take 1 tablet (10 mg total) by mouth once. 06/19/13   Rancour, Garnette, MD  aspirin  325 MG EC tablet Take 81 mg by mouth daily.     [provider]  atorvastatin (LIPITOR) 10 MG tablet Take by mouth daily.    [provider]  Azilsartan Medoxomil (EDARBI PO) Take by mouth.    [provider]  bacitracin  ointment Apply topically 2 (two) times daily. 02/29/12   Cleotilde Rogue, MD  Cholecalciferol 125 MCG (5000 UT) TABS daily.    [provider]  Coenzyme Q10 300 MG CAPS daily.    [provider]  cyclobenzaprine (FLEXERIL) 10 MG tablet cyclobenzaprine 10 mg tablet    [provider]  diphenhydrAMINE  (BENADRYL ) 25 mg capsule Take by mouth.    [provider]  ezetimibe (ZETIA) 10 MG tablet Take 10 mg by mouth daily. 01/27/20   [provider]  labetalol (NORMODYNE) 200 MG tablet  11/28/13   [provider]  levocetirizine (XYZAL) 5 MG tablet levocetirizine 5 mg tablet  TAKE 1 TABLET BY MOUTH EVERY DAY AS NEEDED    [provider]  meloxicam (MOBIC) 7.5 MG tablet meloxicam 7.5 mg tablet  Take 1 tablet every day by oral route.    [provider]  metFORMIN (GLUCOPHAGE) 500 MG tablet Take by mouth 2 (two) times daily with a meal.    [provider]  Methylcobalamin (B-12) 5000 MCG TBDP Take 1 tablet by mouth 2 (two) times a week. Take on Monday and Thursday    [provider]  neomycin -polymyxin-hydrocortisone (CORTISPORIN) OTIC solution Apply 1-2 drops to toe after soaking BID 04/20/20   Magdalen Pasco RAMAN, DPM  pantoprazole  (PROTONIX ) 20 MG tablet Take 1 tablet (20 mg total) by mouth daily. 05/12/17   Doretha Folks, MD  potassium chloride  (K-DUR) 10 MEQ tablet Take 2 tablets (20 mEq total) by mouth 2 (two) times daily. 01/21/15   Geroldine Berg, MD  potassium chloride  (K-DUR,KLOR-CON ) 10 MEQ tablet Take by mouth once.    [provider]  potassium chloride  SA (K-DUR,KLOR-CON ) 20 MEQ tablet Take 20 mEq by mouth 2 (two) times daily.    [provider]  pravastatin (PRAVACHOL) 40 MG tablet  11/28/13   [provider]  SPIRONOLACTONE PO Take by mouth.    [provider]  sulfamethoxazole-trimethoprim (BACTRIM DS)  800-160 MG per tablet Take 1 tablet by mouth.    [provider]  tadalafil (CIALIS) 5 MG tablet Take by mouth. 05/09/19   [provider]  terbinafine (LAMISIL) 250 MG tablet terbinafine HCl 250 mg tablet    [provider]  triamterene-hydrochlorothiazide (DYAZIDE) 37.5-25 MG capsule Take 1 capsule by mouth daily. 02/01/20   [provider]      Allergies    Penicillins and Nitrates, organic    Review of Systems   Review of Systems  Respiratory:  Positive for shortness of breath.   All other  systems reviewed and are negative.   Physical Exam Updated Vital Signs BP (!) 155/64 (BP Location: Right Arm)   Pulse 68   Temp 97.7 F (36.5 C) (Oral)   Resp 16   SpO2 96%  Physical Exam Vitals and nursing note reviewed.  Constitutional:      General: He is not in acute distress.    Appearance: Normal appearance.  HENT:     Head: Normocephalic and atraumatic.  Eyes:     General:        Right eye: No discharge.        Left eye: No discharge.  Cardiovascular:     Rate and Rhythm: Normal rate and regular rhythm.     Heart sounds: No murmur heard.    No friction rub. No gallop.  Pulmonary:     Effort: Pulmonary effort is normal.     Breath sounds: Normal breath sounds.     Comments: No wheezing, rhonchi, stridor, rales.  Normal respiratory rate, normal respiratory effort. Abdominal:     General: Bowel sounds are normal.     Palpations: Abdomen is soft.  Musculoskeletal:     Comments: No notable lower extremity edema.  Skin:    General: Skin is warm and dry.     Capillary Refill: Capillary refill takes less than 2 seconds.  Neurological:     Mental Status: He is alert and oriented to person, place, and time.  Psychiatric:        Mood and Affect: Mood normal.        Behavior: Behavior normal.     ED Results / Procedures / Treatments   Labs (all labs ordered are listed, but only abnormal results are displayed) Labs Reviewed  CBC - Abnormal; Notable for the following components:      Result Value   Platelets 126 (*)    All other components within normal limits  BRAIN NATRIURETIC PEPTIDE - Abnormal; Notable for the following components:   B Natriuretic Peptide 107.2 (*)    All other components within normal limits  BASIC METABOLIC PANEL  URINALYSIS, ROUTINE W REFLEX MICROSCOPIC    EKG None  Radiology DG Chest Portable 1 View Result Date: 06/11/2023 CLINICAL DATA:  Shortness of breath with wheezing at night. EXAM: PORTABLE CHEST 1 VIEW COMPARISON:  12/13/2020  and 06/04/2019 radiographs.  CT 12/11/2009. FINDINGS: 1504 hours. The heart size and mediastinal contours are stable status post median sternotomy. There is chronic elevation of the left hemidiaphragm with associated chronic left basilar atelectasis or scarring. The right lung is clear. No edema, pleural effusion or pneumothorax. No acute osseous findings are evident. IMPRESSION: No evidence of acute cardiopulmonary process. Chronic elevation of the left hemidiaphragm with associated left basilar atelectasis or scarring. Electronically Signed   By: Elsie Perone M.D.   On: 06/11/2023 15:17    Procedures Procedures    Medications Ordered in ED Medications -  No data to display  ED Course/ Medical Decision Making/ A&P Clinical Course as of 06/11/23 1546  Thu Jun 11, 2023  1431 Wheezing / rattling described by wife. No daytime fatigue, feels like he got a full night of sleep. No fever, chills, or other URI symptoms. Hx of aortic valve replacement, bladder cancer. No previous respiratory history. [CP]    Clinical Course User Index [CP] Rosan Sherlean DEL, PA-C                                 Medical Decision Making  This patient is a 68 y.o. male  who presents to the ED for concern of wheezing while sleeping.   Differential diagnoses prior to evaluation: The emergent differential diagnosis includes, but is not limited to, atypical pneumonia, COPD, asthma exacerbation presentation, obstructive sleep apnea, upper respiratory infection, heart failure exacerbation, versus other. This is not an exhaustive differential.   Past Medical History / Co-morbidities / Social History: coronary heart disease, hypertension, hyperlipidemia, history of aortic valve replacement  Additional history: Chart reviewed. Pertinent results include: Reviewed lab, imaging from previous emergency room visits, outpatient oncology visits  Physical Exam: Physical exam performed. The pertinent findings include:  Patient is well-appearing, he is somewhat hypertensive in the ED, blood pressure 155/64, vital signs otherwise stable, 100% oxygen saturation on room air.  He has no accessory breath sounds on auscultation of his lungs.  He has no signs of fluid overload, no edema of lower extremities.  Lab Tests/Imaging studies: I personally interpreted labs/imaging and the pertinent results include: BMP unremarkable, CBC with mild thrombocytopenia which is slightly improved from recent baseline.  His BNP is mildly elevated at 107.2, no other signs of fluid overload, I do not think this is representative of acute heart failure exacerbation, I suspect his BNP is chronically mildly elevated secondary to his history of aortic valve replacement.  I independently interpreted plain film chest x-ray which shows chronic left hemidiaphragm elevation, no evidence of other acute intrathoracic abnormality.SABRA  UA with no signs of infection. I agree with the radiologist interpretation.  Cardiac monitoring: EKG obtained and interpreted by myself and attending physician which shows: Normal sinus rhythm, no acute ST-T changes.   Treatment: Discussed that his symptoms seem possibly consistent with obstructive sleep apnea and I recommend that he follow-up for sleep study as already planned.  Disposition: After consideration of the diagnostic results and the patients response to treatment, I feel that patient stable for discharge with plan as above.   emergency department workup does not suggest an emergent condition requiring admission or immediate intervention beyond what has been performed at this time. The plan is: as above. The patient is safe for discharge and has been instructed to return immediately for worsening symptoms, change in symptoms or any other concerns.  Final Clinical Impression(s) / ED Diagnoses Final diagnoses:  Sleep apnea, unspecified type    Rx / DC Orders ED Discharge Orders     None          Rosan Sherlean DEL, PA-C 06/11/23 1546    Dreama Longs, MD 06/11/23 2322

## 2023-07-16 ENCOUNTER — Telehealth (HOSPITAL_COMMUNITY): Payer: Self-pay

## 2023-07-16 NOTE — Telephone Encounter (Signed)
Called and spoke with pt in regards to CR, pt stated he is not interested at this time and if anything changes he will give [place] a call.

## 2023-07-16 NOTE — Telephone Encounter (Signed)
Outside/paper referral received by Dr. Noe Gens from [facility]. Will fax over Physician order and request further documents. Insurance benefits and eligibility to be determined.
